# Patient Record
Sex: Male | Born: 2000 | Hispanic: No | Marital: Single | Smoking: Never smoker
Health system: Southern US, Community
[De-identification: ages and names within clinical notes are randomized; demographics above are authoritative.]

---
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
## 2018-06-04 ENCOUNTER — Inpatient Hospital Stay (HOSPITAL_COMMUNITY)
Admission: AD | Admit: 2018-06-04 | Discharge: 2018-06-12 | DRG: 885 | Disposition: A | Discharge status: Home / Self Care | Payer: No Typology Code available for payment source | Attending: Psychiatry | Admitting: Psychiatry

## 2018-06-04 ENCOUNTER — Other Ambulatory Visit: Payer: Self-pay

## 2018-06-04 ENCOUNTER — Encounter (HOSPITAL_COMMUNITY): Payer: Self-pay

## 2018-06-04 DIAGNOSIS — Z5320 Procedure and treatment not carried out because of patient's decision for unspecified reasons: Secondary | ICD-10-CM | POA: Diagnosis present

## 2018-06-04 DIAGNOSIS — I10 Essential (primary) hypertension: Secondary | ICD-10-CM | POA: Diagnosis present

## 2018-06-04 DIAGNOSIS — F209 Schizophrenia, unspecified: Secondary | ICD-10-CM | POA: Diagnosis present

## 2018-06-04 DIAGNOSIS — Z7289 Other problems related to lifestyle: Secondary | ICD-10-CM

## 2018-06-04 DIAGNOSIS — F6381 Intermittent explosive disorder: Secondary | ICD-10-CM | POA: Diagnosis present

## 2018-06-04 MED ORDER — ACETAMINOPHEN 325 MG PO TABS: 650.0000 mg | ORAL_TABLET | Freq: Four times a day (QID) | ORAL | Status: DC | PRN

## 2018-06-04 MED ORDER — TRAZODONE HCL 50 MG PO TABS
50.0000 mg | ORAL_TABLET | Freq: Every evening | ORAL | Status: DC | PRN
  Filled 2018-06-04: qty 1

## 2018-06-04 MED ORDER — HYDROXYZINE HCL 25 MG PO TABS
25.0000 mg | ORAL_TABLET | Freq: Three times a day (TID) | ORAL | Status: DC | PRN
  Filled 2018-06-04: qty 1

## 2018-06-04 MED ORDER — ALUM & MAG HYDROXIDE-SIMETH 200-200-20 MG/5ML PO SUSP: 30.0000 mL | ORAL | Status: DC | PRN

## 2018-06-04 MED ORDER — MAGNESIUM HYDROXIDE 400 MG/5ML PO SUSP: 30.0000 mL | Freq: Every day | ORAL | Status: DC | PRN

## 2018-06-04 NOTE — Progress Notes (Signed)
[place] NOVEL CORONAVIRUS (COVID-19) DAILY CHECK-OFF SYMPTOMS - answer yes or no to each - every day NO YES  Have you had a fever in the past 24 hours?  . Fever (Temp > 37.80C / 100F) X   Have you had any of these symptoms in the past 24 hours? . New Cough .  Sore Throat  .  Shortness of Breath .  Difficulty Breathing .  Unexplained Body Aches   X   Have you had any one of these symptoms in the past 24 hours not related to allergies?   . Runny Nose .  Nasal Congestion .  Sneezing   X   If you have had runny nose, nasal congestion, sneezing in the past 24 hours, has it worsened?  X   EXPOSURES - check yes or no X   Have you traveled outside the state in the past 14 days?  X   Have you been in contact with someone with a confirmed diagnosis of COVID-19 or PUI in the past 14 days without wearing appropriate PPE?  X   Have you been living in the same home as a person with confirmed diagnosis of COVID-19 or a PUI (household contact)?    X   Have you been diagnosed with COVID-19?    X              What to do next: Answered NO to all: Answered YES to anything:   Proceed with unit schedule Follow the [facility] Inpatient Flowsheet.   

## 2018-06-04 NOTE — Tx Team (Signed)
Initial Treatment Plan 06/04/2018 7:14 PM [name] OYD:[id number]    PATIENT STRESSORS: Educational concerns Marital or family conflict   PATIENT STRENGTHS: Barrister's clerk for treatment/growth Supportive family/friends   PATIENT IDENTIFIED PROBLEMS: "depression"  "anger"                   DISCHARGE CRITERIA:  Ability to meet basic life and health needs Improved stabilization in mood, thinking, and/or behavior Need for constant or close observation no longer present Verbal commitment to aftercare and medication compliance  PRELIMINARY DISCHARGE PLAN: Attend aftercare/continuing care group Attend PHP/IOP Participate in family therapy Return to previous living arrangement Return to previous work or school arrangements  PATIENT/FAMILY INVOLVEMENT: This treatment plan has been presented to and reviewed with the patient, Lance Rich..  The patient and family have been given the opportunity to ask questions and make suggestions.  [name] [place], [place] 06/04/2018, 7:14 PM

## 2018-06-04 NOTE — H&P (Signed)
Behavioral Health Medical Screening Exam  Lance Rich is an 18 y.o. male.  Total Time spent with patient: 30 minutes Psychiatric Specialty Exam: Physical Exam  Constitutional: He is oriented to person, place, and time. He appears well-developed and well-nourished. No distress.  HENT:  Head: Normocephalic and atraumatic.  Eyes: Pupils are equal, round, and reactive to light. Right eye exhibits no discharge. Left eye exhibits no discharge. No scleral icterus.  Respiratory: Effort normal. No respiratory distress.  Musculoskeletal: Normal range of motion.  Neurological: He is alert and oriented to person, place, and time.  Skin: He is not diaphoretic.  Psychiatric: His mood appears not anxious. He expresses impulsivity and inappropriate judgment. He does not exhibit a depressed mood.    Review of Systems  Constitutional: Negative for chills, diaphoresis, fever, malaise/fatigue and weight loss.  Respiratory: Negative for cough and shortness of breath.   Cardiovascular: Negative for chest pain.  Gastrointestinal: Negative for diarrhea, nausea and vomiting.  Psychiatric/Behavioral: Negative for depression, hallucinations, memory loss, substance abuse and suicidal ideas. The patient is not nervous/anxious and does not have insomnia.     Blood pressure (!) 147/91, pulse 79, temperature 98.3 F (36.8 C), temperature source Oral, resp. rate 20, SpO2 98 %.There is no height or weight on file to calculate BMI.  General Appearance: Casual and Well Groomed  Eye Contact:  Fair  Speech:  Clear and Coherent and Normal Rate  Volume:  Normal  Mood:  Negative  Affect:  Congruent  Thought Process:  Coherent and Descriptions of Associations: Intact  Orientation:  Full (Time, Place, and Person)  Thought Content:  Logical and Hallucinations: Denies  Suicidal Thoughts:  Denies  Homicidal Thoughts:  Denies  Memory:  Immediate;   Good Recent;   Fair  Judgement:  Fair  Insight:  Fair  Psychomotor  Activity:  Normal  Concentration:  Concentration: Good and Attention Span: Good  Recall:  Good  Fund of Knowledge:  Good  Language:  Good  Akathisia:  Negative  Handed:  Right  AIMS (if indicated):     Assets:  Communication Skills Housing Intimacy Leisure Time Physical Health  ADL's:  Intact  Cognition:  WNL  Sleep:          Blood pressure (!) 147/91, pulse 79, temperature 98.3 F (36.8 C), temperature source Oral, resp. rate 20, SpO2 98 %.  Recommendations:  Based on my evaluation the patient does not appear to have an emergency medical condition.  Jackelyn Poling, NP 06/04/2018, 6:00 AM
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
## 2018-06-04 NOTE — Progress Notes (Signed)
[place] NOVEL CORONAVIRUS (COVID-19) DAILY CHECK-OFF SYMPTOMS - answer yes or no to each - every day NO YES  Have you had a fever in the past 24 hours?  . Fever (Temp > 37.80C / 100F) X   Have you had any of these symptoms in the past 24 hours? . New Cough .  Sore Throat  .  Shortness of Breath .  Difficulty Breathing .  Unexplained Body Aches   X   Have you had any one of these symptoms in the past 24 hours not related to allergies?   . Runny Nose .  Nasal Congestion .  Sneezing   X   If you have had runny nose, nasal congestion, sneezing in the past 24 hours, has it worsened?  X   EXPOSURES - check yes or no X   Have you traveled outside the state in the past 14 days?  X   Have you been in contact with someone with a confirmed diagnosis of COVID-19 or PUI in the past 14 days without wearing appropriate PPE?  X   Have you been living in the same home as a person with confirmed diagnosis of COVID-19 or a PUI (household contact)?    X   Have you been diagnosed with COVID-19?    X              What to do next: Answered NO to all: Answered YES to anything:   Proceed with unit schedule Follow the [facility] Inpatient Flowsheet.   

## 2018-06-04 NOTE — Progress Notes (Signed)
Pt transferred to [facility] Adult Unit. Report given to receiving nurse Meriam Sprague, RN.

## 2018-06-04 NOTE — Plan of Care (Signed)
Nurse discussed coping skills with patient.  

## 2018-06-04 NOTE — Discharge Summary (Signed)
  Patient recommended for inpatient psychiatric treatment.  Patient accepted to [facility] inpatient psychiatric treatment  Galen Russman B. Charnelle Bergeman, NP

## 2018-06-04 NOTE — Progress Notes (Addendum)
Patient was escorted from Obs Unit to Adult Unit 500 hall.  Patient denied SI and HI.  Denied A/V hallucinations.  Denied pain.  Patient was given food, drink and oriented to 500 hall. Patient stated he has his own apartment and plans to return to his apartment after discharge.  Has his own car to drive to appointments.  Patient stated he is going to high school.   Patient denied all drug, tobacco, alcohol use.  Respirations even and unlabored.  No signs/symptoms of pain/distress noted on patient's face or body movements. Safety maintained with 15 minute checks.

## 2018-06-04 NOTE — Progress Notes (Signed)
Pt refused to give writer sample of urine. Pt states "I know by law that I don't have to do it". Encouragement given. UA cup left by bedside.

## 2018-06-04 NOTE — Plan of Care (Signed)
D: Patient is alert, pleasant, and cooperative. Denies SI, HI, AVH, and verbally contracts for safety. Patient interaction is minimal. Patient denies physical symptoms/pain.    A: Medications administered per MD order. Support provided. Patient educated on safety on the unit and medications. Routine safety checks every 15 minutes. Patient stated understanding to tell nurse about any new physical symptoms. Patient understands to tell staff of any needs.     R: No adverse drug reactions noted. Patient verbally contracts for safety. Patient remains safe at this time and will continue to monitor.   Problem: Education: Goal: Knowledge of [facility] Education information/materials will improve Outcome: Progressing   Patient oriented to the unit. Patient remains safe and will continue to monitor.   [place] NOVEL CORONAVIRUS (COVID-19) DAILY CHECK-OFF SYMPTOMS - answer yes or no to each - every day NO YES  Have you had a fever in the past 24 hours?  Fever (Temp > 37.80C / 100F) X   Have you had any of these symptoms in the past 24 hours? New Cough  Sore Throat   Shortness of Breath  Difficulty Breathing  Unexplained Body Aches   X   Have you had any one of these symptoms in the past 24 hours not related to allergies?   Runny Nose  Nasal Congestion  Sneezing   X   If you have had runny nose, nasal congestion, sneezing in the past 24 hours, has it worsened?  X   EXPOSURES - check yes or no X   Have you traveled outside the state in the past 14 days?  X   Have you been in contact with someone with a confirmed diagnosis of COVID-19 or PUI in the past 14 days without wearing appropriate PPE?  X   Have you been living in the same home as a person with confirmed diagnosis of COVID-19 or a PUI (household contact)?    X   Have you been diagnosed with COVID-19?    X              What to do next: Answered NO to all: Answered YES to anything:   Proceed with unit schedule Follow the  [facility] Inpatient Flowsheet.

## 2018-06-04 NOTE — Plan of Care (Signed)
[facility] Observation Crisis Plan  Reason for Crisis Plan:  Chronic Mental Illness/Medical Illness and Crisis Stabilization   Plan of Care:  Referral for Inpatient Hospitalization and Referral for Telepsychiatry/Psychiatric Consult  Family Support:  Parents   Current Living Environment:  Private Residence with Parents   Insurance:   Hospital Account    Name Acct ID Class Status Primary Coverage   Azavier, Tosti [id number] BEHAVIORAL HEALTH OBSERVATION Open None        Guarantor Account (for Hospital Account [id number])    Name Relation to Pt Service Area Active? Acct Type   Richrd Prime Self [facility] Yes [facility]   Address Phone       [address] [place], [place] [address] [phone](H)          Coverage Information (for Hospital Account [id number])    Not on file      Legal Guardian:  Pt is own guardian   Primary Care Provider:  Patient, No Pcp Per  Current Outpatient Providers:  Vesta Mixer  Psychiatrist:    Counselor/Therapist:    Compliant with Medications:  Yes  Additional Information:   Tyrone Apple 5/27/20202:08 AM

## 2018-06-05 DIAGNOSIS — F6381 Intermittent explosive disorder: Secondary | ICD-10-CM

## 2018-06-05 MED ORDER — ZIPRASIDONE MESYLATE 20 MG IM SOLR
20.0000 mg | INTRAMUSCULAR | Status: DC | PRN
  Filled 2018-06-05: qty 20

## 2018-06-05 MED ORDER — TEMAZEPAM 30 MG PO CAPS
30.0000 mg | ORAL_CAPSULE | Freq: Every day | ORAL | Status: DC
  Administered 2018-06-11: 30 mg via ORAL
  Filled 2018-06-05 (×4): qty 1

## 2018-06-05 MED ORDER — LORAZEPAM 1 MG PO TABS
1.0000 mg | ORAL_TABLET | ORAL | Status: DC | PRN
  Filled 2018-06-05: qty 1

## 2018-06-05 MED ORDER — RISPERIDONE 3 MG PO TABS
3.0000 mg | ORAL_TABLET | Freq: Two times a day (BID) | ORAL | Status: DC
  Administered 2018-06-05 – 2018-06-07 (×4): 3 mg via ORAL
  Filled 2018-06-05 (×9): qty 1

## 2018-06-05 MED ORDER — ARIPIPRAZOLE 5 MG PO TABS
5.0000 mg | ORAL_TABLET | Freq: Every day | ORAL | Status: DC
  Filled 2018-06-05 (×3): qty 1

## 2018-06-05 MED ORDER — BENZTROPINE MESYLATE 0.5 MG PO TABS
0.5000 mg | ORAL_TABLET | Freq: Two times a day (BID) | ORAL | Status: DC
  Administered 2018-06-05 – 2018-06-12 (×10): 0.5 mg via ORAL
  Filled 2018-06-05: qty 1
  Filled 2018-06-05: qty 14
  Filled 2018-06-05 (×7): qty 1
  Filled 2018-06-05: qty 14
  Filled 2018-06-05 (×11): qty 1

## 2018-06-05 MED ORDER — OLANZAPINE 5 MG PO TBDP
5.0000 mg | ORAL_TABLET | Freq: Three times a day (TID) | ORAL | Status: DC | PRN
  Filled 2018-06-05: qty 1

## 2018-06-05 NOTE — Progress Notes (Signed)
Recreation Therapy Notes   Date: 06/05/2018 Time: 10:00- 10:30  am Location: 500 hall Day Room   Group Topic: Coping Skills   Goal Area(s) Addresses:  Patient will successfully identify what a coping skill is. Patient will successfully identify coping skills they can use post d/c.  Patient will successfully identify benefit of using coping skills post d/c.  Behavioral Response: appropriate, observing   Intervention: Coping skills   Activity: Patients and Lrt had a group discussion on what a coping skill is, and examples of coping skills. Patients and LRT had a group discussion and went over each individual letter of the alphabet, and possible coping skills that were legal, appropriate, and positive to use. Patients followed along and were encouraged to copy down the list (of coping skills) that was talked about in group and written on the board. Patients were encouraged to write any additional coping skills they have on their individual sheets, and brainstorm potential coping skills to utilize post discharge.  Education: Pharmacologist, Building control surveyor.   Education Outcome: Acknowledges education  Clinical Observations/Feedback: Patient came to group late and did not wish to write, although was listening to group conversation. Patient left group shortly after joining group. Patient did not add any remarks or comments to group conversation.Lance Rich, LRT/CTRS       Lance Rich 06/05/2018 12:50 PM

## 2018-06-05 NOTE — [facility] Suicide Risk Assessment (Signed)
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  World Science writer [facility]). Score between 0-7:  no or low risk or alcohol related problems. Score between 8-15:  moderate risk of alcohol related problems. Score between 16-19:  high risk of alcohol related problems. Score 20 or above:  warrants further diagnostic evaluation for alcohol dependence and treatment.   CLINICAL FACTORS:   Schizophrenia:   Paranoid or undifferentiated type   Musculoskeletal: Strength & Muscle Tone: within normal limits Gait & Station: normal Patient leans: N/A  Psychiatric Specialty Exam: Physical Exam  ROS  Blood pressure (!) 154/96, pulse 79, temperature 98.1 F (36.7 C), temperature source Oral, resp. rate 18, height 5\' 10"  (1.778 m), weight 60.8 kg, SpO2 92 %.Body mass index is 19.23 kg/m.  General Appearance: Casual  Eye Contact:  Good  Speech:  Normal Rate  Volume:  Normal  Mood:  Euthymic  Affect:  Congruent and Constricted  Thought Process:  Linear and Descriptions of Associations: Loose  Orientation:  Full (Time, Place, and Person)  Thought Content:  Illogical and Hallucinations: Auditory  Suicidal Thoughts:  No  Homicidal Thoughts:  No  Memory:  3/3  Judgement:  Fair  Insight:  Shallow  Psychomotor Activity:  Normal  Concentration:  Concentration: Fair  Recall:  [employer] of Knowledge:  Fair  Language:  Fair  Akathisia:   Negative  Handed:  Right  AIMS (if indicated):     Assets:  Physical Health Resilience Social Support Talents/Skills  ADL's:  Intact  Cognition:  WNL  Sleep:  Number of Hours: 2      COGNITIVE FEATURES THAT CONTRIBUTE TO RISK:  Loss of executive function    SUICIDE RISK:   Minimal: No identifiable suicidal ideation.  Patients presenting with no risk factors but with morbid ruminations; may be classified as minimal risk based on the severity of the depressive symptoms  PLAN OF CARE: see orders   I certify that inpatient services furnished can reasonably be expected to improve the patient's condition.   Malvin Johns, MD 06/05/2018, 11:21 AM

## 2018-06-05 NOTE — Progress Notes (Signed)
Patient denies SI, HI and AVH.  Patient has attended groups, been compliant with medications and had no incidents of behavioral dyscontrol this shift.   Assess patient for safety, offer medication as prescribed, engage patient in 1:1 staff talks.   Patient able to contract for safety continue to monitor as planned.  
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
## 2018-06-05 NOTE — [facility] Group Notes (Signed)
[facility] LCSW Group Therapy Note  Date/Time: 06/05/18, 1315  Type of Therapy/Topic:  Group Therapy:  Balance in Life  Participation Level:  Did not attend  Description of Group:    This group will address the concept of balance and how it feels and looks when one is unbalanced. Patients will be encouraged to process areas in their lives that are out of balance, and identify reasons for remaining unbalanced. Facilitators will guide patients utilizing problem- solving interventions to address and correct the stressor making their life unbalanced. Understanding and applying boundaries will be explored and addressed for obtaining  and maintaining a balanced life. Patients will be encouraged to explore ways to assertively make their unbalanced needs known to significant others in their lives, using other group members and facilitator for support and feedback.  Therapeutic Goals: 1. Patient will identify two or more emotions or situations they have that consume much of in their lives. 2. Patient will identify signs/triggers that life has become out of balance:  3. Patient will identify two ways to set boundaries in order to achieve balance in their lives:  4. Patient will demonstrate ability to communicate their needs through discussion and/or role plays  Summary of Patient Progress:          Therapeutic Modalities:   Cognitive Behavioral Therapy Solution-Focused Therapy Assertiveness Training  Greg Lamanda Rudder, LCSW 

## 2018-06-06 NOTE — Progress Notes (Signed)
Patient refusing to give urine sample and for this writer to obtain EKG. "I"m not looking to do all that. I just want to be left alone." Explained to patient this would help precipitate his discharge planning however patient again refused.

## 2018-06-06 NOTE — Progress Notes (Signed)
Recreation Therapy Notes  Date: 06/06/2018 Time: 10:00 am Location: 500 hall   Group Topic: Introduction to Anxiety  Goal Area(s) Addresses:  Patient will work on worksheet on Introduction to Anxiety. Patient will follow directions on first prompt.  Behavioral Response: Appropriate  Intervention: Worksheet  Activity:  Staff on 500 hall were provided with a worksheet on Introduction to Anxiety. Staff was instructed to give it to the patients and have them work on it in place of Recreation Therapy Group. Staff was also given 2 coloring sheets and were given the option to give them out.  Education:  Ability to follow Directions, Change of thought processes Discharge Planning.   Education Outcome: Acknowledges education/In group clarification offered  Clinical Observations/Feedback: . Due to COVID-19, guidelines group was not held. Group members were provided a learning activity worksheet to work on the topic and above-stated goals. LRT is available to answer any questions patient may have regarding the worksheet.  Lance Rich, LRT/CTRS         Lance Rich 06/06/2018 12:07 PM

## 2018-06-06 NOTE — Progress Notes (Signed)
Patient denies SI, HI and AVH this shift.  Patient has attended groups and been compliant with medications and participated in unit activities.  Patient has exhibited no behavioral dyscontrol this shift.   Assess patient for safety, offer medications as prescribed, engage patient in 1:1 therapeutic staff talks.   Patient able to contract for safety continue to monitor as planned.   

## 2018-06-06 NOTE — Progress Notes (Signed)
Pt in bed resting with eyes closed no distress noted and no complaints   Q 15 min checks for safety   Safety maintained

## 2018-06-06 NOTE — Progress Notes (Signed)
[facility] MD Progress Note  06/06/2018 1:09 PM Lance Rich  MRN:  [id number] Subjective:   In summary this 18 year old was petition for recent violence, auditory hallucinations, delusional believes he is generally stabilizing he is compliant with meds he has no involuntary movements today.  No thoughts of harming self or others denying all positive symptoms.  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 No pertinent past medical history. History reviewed. No pertinent surgical history. Family History:  Family History  Problem Relation Age of Onset  . Healthy Mother   . Healthy Father     Social History:  Social History   Substance and Sexual Activity  Alcohol Use No     Social History   Substance and Sexual Activity  Drug Use Not Currently  . Types: Marijuana    Social History   Socioeconomic History  . Marital status: Single    Spouse name: Not on file  . Number of children: Not on file  . Years of education: Not on file  . Highest education level: Not on file  Occupational History  . Not on file  Social Needs  . Financial resource strain: Not on file  . Food insecurity:    Worry: Not on file    Inability: Not on file  . Transportation needs:    Medical: Not on file    Non-medical: Not on file  Tobacco Use  . Smoking status: Never Smoker  . Smokeless tobacco: Never Used  Substance and Sexual Activity  . Alcohol use: No  . Drug use: Not Currently    Types: Marijuana  . Sexual activity: Not on file  Lifestyle  . Physical activity:    Days per week: Not on file    Minutes per session: Not on file  . Stress: Not on file  Relationships  . Social connections:    Talks on phone: Not on file    Gets together: Not on file   Attends religious service: Not on file    Active member of club or organization: Not on file    Attends meetings of clubs or organizations: Not on file    Relationship status: Not on file  Other Topics Concern  . Not on file  Social History Narrative  . Not on file   Additional Social History:    Pain Medications: Please see MAR Prescriptions: Please see MAR Over the Counter: Please see MAR History of alcohol / drug use?: Yes Longest period of sobriety (when/how long): 2 years Name of Substance 1: Marijuana 1 - Age of First Use: 16 1 - Amount (size/oz): Varied 1 - Frequency: Varied 1 - Duration: Unknown 1 - Last Use / Amount: 2 years ago                  Sleep: Good  Appetite:  Good  Current Medications: Current Facility-Administered Medications  Medication Dose Route Frequency Provider Last Rate Last Dose  . acetaminophen (TYLENOL) tablet 650 mg  650 mg Oral Q6H PRN Rankin, Shuvon B, NP      . alum & mag hydroxide-simeth (MAALOX/MYLANTA) 200-200-20 MG/5ML suspension 30 mL  30 mL Oral Q4H PRN Rankin, Shuvon B, NP      . benztropine (COGENTIN) tablet 0.5 mg  0.5 mg Oral BID Malvin Johns, MD   0.5  mg at 06/06/18 0827  . hydrOXYzine (ATARAX/VISTARIL) tablet 25 mg  25 mg Oral TID PRN Rankin, Shuvon B, NP      . OLANZapine zydis (ZYPREXA) disintegrating tablet 5 mg  5 mg Oral Q8H PRN Aldean BakerSykes, Janet E, NP       And  . LORazepam (ATIVAN) tablet 1 mg  1 mg Oral PRN Aldean BakerSykes, Janet E, NP       And  . ziprasidone (GEODON) injection 20 mg  20 mg Intramuscular PRN Aldean BakerSykes, Janet E, NP      . magnesium hydroxide (MILK OF MAGNESIA) suspension 30 mL  30 mL Oral Daily PRN Rankin, Shuvon B, NP      . risperiDONE (RISPERDAL) tablet 3 mg  3 mg Oral BID Malvin JohnsFarah, Mauriana Dann, MD   3 mg at 06/06/18 0827  . temazepam (RESTORIL) capsule 30 mg  30 mg Oral QHS Malvin JohnsFarah, Nile Prisk, MD      . traZODone (DESYREL) tablet 50 mg  50 mg Oral QHS PRN Rankin, Shuvon B, NP        Lab Results: No results found for this or  any previous visit (from the past 48 hour(s)).  Blood Alcohol level:  Lab Results  Component Value Date   ETH <5 05/07/2016   ETH <5 03/15/2016    Metabolic Disorder Labs: No results found for: HGBA1C, MPG No results found for: PROLACTIN No results found for: CHOL, TRIG, HDL, CHOLHDL, VLDL, LDLCALC  Physical Findings: AIMS:  , ,  ,  ,    CIWA:    COWS:     Musculoskeletal: Strength & Muscle Tone: within normal limits Gait & Station: normal Patient leans: N/A  Psychiatric Specialty Exam: Physical Exam  ROS  Blood pressure (!) 154/96, pulse 79, temperature 98.1 F (36.7 C), temperature source Oral, resp. rate 18, height 5\' 10"  (1.778 m), weight 60.8 kg, SpO2 92 %.Body mass index is 19.23 kg/m.  General Appearance: Casual  Eye Contact:  Good  Speech:  Clear and Coherent  Volume:  Normal  Mood:  Euthymic  Affect:  Blunt and Congruent  Thought Process:  Irrelevant and Descriptions of Associations: Loose  Orientation:  Full (Time, Place, and Person)  Thought Content:  Illogical and Tangential  Suicidal Thoughts:  No  Homicidal Thoughts:  No  Memory:  Immediate;   Fair  Judgement:  Fair  Insight:  Fair  Psychomotor Activity:  Normal  Concentration:  Concentration: Fair  Recall:  Fair  Fund of Knowledge:  Good  Language:  Good  Akathisia:  Negative  Handed:  Right  AIMS (if indicated):     Assets:  Communication Skills Leisure Time Physical Health  ADL's:  Intact  Cognition:  WNL  Sleep:  Number of Hours: 4.5     Treatment Plan Summary: Daily contact with patient to assess and evaluate symptoms and progress in treatment, Medication management and Plan Continue Risperdal continue reality-based therapy probable discharge at the beginning of next week  Tyna Huertas, MD 06/06/2018, 1:09 PM

## 2018-06-06 NOTE — [facility] Group Notes (Signed)
06/06/18, 1330  CSW completed group with patients.  Unstructured, check in group about how patients were currently doing today.  Group discussion also included how to manage receiving bad news and how patients cope with negative developments in their day.  Pt attended entire group but did not appear engaged or to be paying much attention.  When called upon by CSW, pt responded appropriately, said he was doing great today and all was well. Garner Nash, MSW, LCSW Clinical Social Worker 06/06/2018 3:33 PM

## 2018-06-06 NOTE — Progress Notes (Signed)
D: Patient observed resting in bed. On approach patient is guarded, resistant to interaction with this Clinical research associate. Becomes irritable with basic questions and refuses to provide urine sample, complete EKG or take scheduled restoril. Patient denies AVH however is observed to be preoccupied. Patient's affect pensive, preoccupied and mood is labile.   Denies pain, physical complaints. COVID-19 screen negative, afebrile. Respiratory assessment WDL.  A: Patient refusing sleep medication however medication education provided. Level III obs in place for safety. Emotional support offered. Patient encouraged to complete Suicide Safety Plan before discharge. Encouraged to attend and participate in unit programming.    R: Patient verbalizes some understanding of POC. Patient denies SI/HI/AVH and remains safe on level III obs. Will continue to monitor throughout the night.

## 2018-06-06 NOTE — Tx Team (Signed)
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 acetaminophen (TYLENOL) tablet 650 mg  650 mg Oral Q6H PRN Rankin, Shuvon B, NP      . alum & mag hydroxide-simeth (MAALOX/MYLANTA) 200-200-20 MG/5ML suspension 30 mL  30 mL Oral Q4H PRN Rankin, Shuvon B, NP      . benztropine (COGENTIN) tablet 0.5 mg  0.5 mg Oral BID Malvin JohnsFarah, Brian, MD   0.5 mg at 06/06/18 0827  . hydrOXYzine (ATARAX/VISTARIL) tablet 25 mg  25 mg Oral TID PRN Rankin, Shuvon B, NP      . OLANZapine zydis (ZYPREXA) disintegrating tablet 5 mg  5 mg Oral Q8H PRN Aldean BakerSykes, Janet E, NP       And  . LORazepam (ATIVAN) tablet 1 mg  1 mg Oral PRN Aldean BakerSykes, Janet E, NP       And  . ziprasidone (GEODON) injection 20 mg  20 mg Intramuscular PRN Aldean BakerSykes, Janet E, NP      . magnesium hydroxide (MILK OF MAGNESIA) suspension 30 mL  30 mL Oral Daily PRN Rankin, Shuvon B, NP      . risperiDONE (RISPERDAL) tablet 3 mg  3 mg Oral BID Malvin JohnsFarah, Brian, MD   3 mg at 06/06/18 0827  . temazepam (RESTORIL) capsule 30 mg  30 mg Oral QHS Malvin JohnsFarah, Brian, MD      . traZODone (DESYREL) tablet 50 mg  50 mg Oral QHS PRN Rankin, Shuvon B, NP       PTA Medications: Medications Prior to Admission  Medication Sig Dispense Refill Last Dose  . escitalopram (LEXAPRO) 10 MG tablet Take 10 mg by mouth daily.   Past Month at Unknown time  . hydrOXYzine (ATARAX/VISTARIL) 25 MG tablet Take 25 mg by mouth every 6 (six) hours as needed for anxiety.   Past Month at Unknown time    Patient Stressors: Educational concerns Marital or family conflict  Patient Strengths:  Barrister's clerkCommunication skills Motivation for treatment/growth Supportive family/friends  Treatment Modalities: Medication Management, Group therapy, Case management,  1 to 1 session with clinician, Psychoeducation, Recreational therapy.   Physician Treatment Plan for Primary Diagnosis: Intermittent explosive disorder Long Term Goal(s): Improvement in symptoms so as ready for discharge Improvement in symptoms so as ready for discharge   Short Term Goals: Ability to identify changes in lifestyle to reduce recurrence of condition will improve Ability to verbalize feelings will improve Ability to demonstrate self-control will improve Ability to identify and develop effective coping behaviors will improve Compliance with prescribed medications will improve  Medication Management: Evaluate patient's response, side effects, and tolerance of medication regimen.  Therapeutic Interventions: 1 to 1 sessions, Unit Group sessions and Medication administration.  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  Therapeutic Interventions: 1 to 1 sessions, Unit Group sessions and Medication administration.  Evaluation of Outcomes: Progressing   RN Treatment Plan for Primary Diagnosis:  Intermittent explosive disorder Long Term Goal(s): Knowledge of disease and therapeutic regimen to maintain health will improve  Short Term Goals: Ability to identify and develop effective coping behaviors will improve and Compliance with prescribed medications will improve  Medication Management: RN will administer medications as ordered by provider, will assess and evaluate patient's response and provide education to patient for prescribed medication. RN will report any adverse and/or side effects to prescribing provider.  Therapeutic Interventions: 1 on 1 counseling sessions, Psychoeducation, Medication administration, Evaluate responses to treatment, Monitor vital signs and CBGs as ordered, Perform/monitor CIWA, COWS, AIMS and Fall Risk screenings as ordered, Perform wound care treatments as ordered.  Evaluation of Outcomes: Progressing   LCSW Treatment Plan for Primary Diagnosis: Intermittent explosive disorder Long Term Goal(s): Safe transition to appropriate next level of care at discharge, Engage patient in therapeutic group addressing interpersonal concerns.  Short Term Goals: Engage patient in aftercare planning with referrals and resources, Increase social support and Increase skills for wellness and recovery  Therapeutic Interventions: Assess for all discharge needs, 1 to 1 time with Social worker, Explore available resources and support systems, Assess for adequacy in community support network, Educate family and significant other(s) on suicide prevention, Complete Psychosocial Assessment, Interpersonal group therapy.  Evaluation of Outcomes: Progressing   Progress in Treatment: Attending groups: No. Participating in groups: No. Taking medication as prescribed: Yes. Toleration medication: Yes. Family/Significant other contact made: No, will contact:  parents Patient understands diagnosis: Yes. Discussing patient identified problems/goals with staff: Yes. Medical problems  stabilized or resolved: Yes. Denies suicidal/homicidal ideation: Yes. Issues/concerns per patient self-inventory: No. Other: none  New problem(s) identified: No, Describe:  none  New Short Term/Long Term Goal(s):  Patient Goals:  "better mental health"  Discharge Plan or Barriers:   Reason for Continuation of Hospitalization: Delusions  Medication stabilization  Estimated Length of Stay:2-4 days  Attendees: Patient:Lance Rich 06/06/2018   Physician: Dr. Jeannine Kitten, MD 06/06/2018   Nursing: Norma Fredrickson, RN 06/06/2018   RN Care Manager: 06/06/2018   Social Worker: Daleen Squibb, LCSW 06/06/2018   Recreational Therapist:  06/06/2018   Other:  06/06/2018   Other:  06/06/2018   Other: 06/06/2018      Scribe for Treatment Team: Lorri Frederick, LCSW 06/06/2018 1:25 PM

## 2018-06-07 MED ORDER — RISPERIDONE 1 MG PO TABS
1.0000 mg | ORAL_TABLET | Freq: Every day | ORAL | Status: DC
  Administered 2018-06-08 – 2018-06-11 (×4): 1 mg via ORAL
  Filled 2018-06-07 (×5): qty 1

## 2018-06-07 MED ORDER — RISPERIDONE 3 MG PO TABS
5.0000 mg | ORAL_TABLET | Freq: Every day | ORAL | Status: DC
  Filled 2018-06-07 (×6): qty 1

## 2018-06-07 NOTE — Progress Notes (Signed)
Patient ID: Marlene Hathway, male   DOB: May 14, 2000, 18 y.o.   MRN: [id number]  While cleaning the room, the environmental service staff member observed that the bolted bench had been pulled out of the wall. Upon inspection from patient's nurse and writer, the screw was observed on the floor and the bench was pulled out several inches on the left side. Writer removed screw from the room and pushed bench into the wall. Patient was instructed to only use the bench for sitting/storage of personal belongings and not to move it. Patient verbalized understanding and was unharmed. Facilities notified and a work order was placed.

## 2018-06-07 NOTE — Progress Notes (Signed)
D: Upon initial interaction, patient is observed standing at the door of his room. Patient appears watchful and suspicious of others. It is also noted that patients furniture (bench/cubby) was removed from the wall. Loose screw removed and Charge RN notified. Patient is minimal and replies to this writers questions with head nods, though interactions have become much more assertive as the day progresses. Patient denies any sleep or appetite disturbance when asked, and shared that he ate some of his breakfast. Patient was observed by staff throwing away his salad from lunch without eating it. Patient denies that he wanted anything else to eat at the time. Patient continues to refuse to provide urine sample to staff. Patient remains compliant with day time medications at this time.   A: Support and encouragement provided. Routine safety checks conducted every 15 minutes per unit protocol. Encouraged to notify if thoughts of harm toward self or others arise. Patient agrees.   R: Patient remains safe at this time, verbally contracting for safety. Will continue to monitor.

## 2018-06-07 NOTE — Progress Notes (Signed)
Adult Psychoeducational Group Note  Date:  06/07/2018 Time:  9:30am-10:00am  Group Topic/Focus:  Goals Group:   The focus of this group is to help patients establish daily goals to achieve during treatment and discuss how the patient can incorporate goal setting into their daily lives to aide in recovery.  Participation Level:  Active  Participation Quality:  Appropriate and Attentive  Affect:  Appropriate  Cognitive:  Alert, Appropriate and Oriented  Insight: Appropriate and Good  Engagement in Group:  Engaged  Modes of Intervention:  Discussion and Support  Additional Comments:  Patient informed the group that his goal for the day is to keep his energy high and meet others. Patient informed the group that he will work towards this goal by recognizing that his fellow group members share problems and by spending time with others and working to understand others within the group he can get to know them better. Patient informed the group that he would rate his day as a 10. Patient informed the group that his day is a 10 because he is happy and comfortable where he is at. Patient informed the group that he is looking forward to meeting and talking to others.   Annye Asa 06/07/2018, 5:14 PM

## 2018-06-07 NOTE — Progress Notes (Signed)
Update: Patient continues to refuse urine sample.

## 2018-06-07 NOTE — Progress Notes (Addendum)
[facility] MD Progress Note  06/07/2018 2:43 PM Lance Rich  MRN:  [id number]   Subjective: Patient reports today that he is feeling good.  He states that his only complaint is that the risperidone was making him too sleepy during the mornings and he does not feel like he can get out of bed and do much.  He states he agrees to take it but just not that have a dose.  Patient denies any suicidal or homicidal ideations and patient currently denies any hallucinations.  Patient stated that he slept very well last night and he denies any medication side effects.  Patient reports that his appetite is been fair.  He feels that he is improving.  He is in agreement to stay through the weekend and potentially be discharged on Monday per Dr. Jeannine Kitten.  Objective: Patient's chart and findings reviewed and discussed with treatment team.  Patient presents in his room lying in the bed but he is awake.  Patient is pleasant, calm, and cooperative.  Reviewed patient's medications and will adjust the Risperdal to 1 mg during the day and 5 mg at night to continue having a total dosage of 6 mg for the day.  Patient did refuse to take his Restoril last night, but based on the patient's report of sleep the Risperdal may be plenty for him to sleep through the night.  It was also reported by staff that it appears that the patient has been responding to internal stimuli but continues to deny having any type of auditory or visual hallucinations.  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 No pertinent past medical history. History reviewed. No pertinent surgical history. Family History:  Family History  Problem Relation Age of Onset  . Healthy Mother   . Healthy Father    Family Psychiatric  History:  See H&P Social History:  Social History   Substance and Sexual Activity  Alcohol Use No     Social History   Substance and Sexual Activity  Drug Use Not Currently  . Types: Marijuana    Social History   Socioeconomic History  . Marital status: Single    Spouse name: Not on file  . Number of children: Not on file  . Years of education: Not on file  . Highest education level: Not on file  Occupational History  . Not on file  Social Needs  . Financial resource strain: Not on file  . Food insecurity:    Worry: Not on file    Inability: Not on file  . Transportation needs:    Medical: Not on file    Non-medical: Not on file  Tobacco Use  . Smoking status: Never Smoker  . Smokeless tobacco: Never Used  Substance and Sexual Activity  . Alcohol use: No  . Drug use: Not Currently    Types: Marijuana  . Sexual activity: Not on file  Lifestyle  . Physical activity:    Days per week: Not on file    Minutes per session: Not on file  . Stress: Not on file  Relationships  . Social connections:    Talks on phone: Not on file    Gets together: Not on file    Attends religious service: Not on file    Active member of club or organization: Not  on file    Attends meetings of clubs or organizations: Not on file    Relationship status: Not on file  Other Topics Concern  . Not on file  Social History Narrative  . Not on file   Additional Social History:    Pain Medications: Please see MAR Prescriptions: Please see MAR Over the Counter: Please see MAR History of alcohol / drug use?: Yes Longest period of sobriety (when/how long): 2 years Name of Substance 1: Marijuana 1 - Age of First Use: 16 1 - Amount (size/oz): Varied 1 - Frequency: Varied 1 - Duration: Unknown 1 - Last Use / Amount: 2 years ago                  Sleep: Good  Appetite:  Fair  Current Medications: Current Facility-Administered Medications  Medication Dose Route Frequency Provider Last  Rate Last Dose  . acetaminophen (TYLENOL) tablet 650 mg  650 mg Oral Q6H PRN Rankin, Shuvon B, NP      . alum & mag hydroxide-simeth (MAALOX/MYLANTA) 200-200-20 MG/5ML suspension 30 mL  30 mL Oral Q4H PRN Rankin, Shuvon B, NP      . benztropine (COGENTIN) tablet 0.5 mg  0.5 mg Oral BID Malvin JohnsFarah, Brian, MD   0.5 mg at 06/07/18 0849  . hydrOXYzine (ATARAX/VISTARIL) tablet 25 mg  25 mg Oral TID PRN Rankin, Shuvon B, NP      . OLANZapine zydis (ZYPREXA) disintegrating tablet 5 mg  5 mg Oral Q8H PRN Aldean BakerSykes, Janet E, NP       And  . LORazepam (ATIVAN) tablet 1 mg  1 mg Oral PRN Aldean BakerSykes, Janet E, NP       And  . ziprasidone (GEODON) injection 20 mg  20 mg Intramuscular PRN Aldean BakerSykes, Janet E, NP      . magnesium hydroxide (MILK OF MAGNESIA) suspension 30 mL  30 mL Oral Daily PRN Rankin, Shuvon B, NP      . [START ON 06/08/2018] risperiDONE (RISPERDAL) tablet 1 mg  1 mg Oral Daily Money, Gerlene Burdockravis B, FNP      . risperiDONE (RISPERDAL) tablet 5 mg  5 mg Oral QHS Money, Feliz Beamravis B, FNP      . temazepam (RESTORIL) capsule 30 mg  30 mg Oral QHS Malvin JohnsFarah, Brian, MD      . traZODone (DESYREL) tablet 50 mg  50 mg Oral QHS PRN Rankin, Shuvon B, NP        Lab Results: No results found for this or any previous visit (from the past 48 hour(s)).  Blood Alcohol level:  Lab Results  Component Value Date   ETH <5 05/07/2016   ETH <5 03/15/2016    Metabolic Disorder Labs: No results found for: HGBA1C, MPG No results found for: PROLACTIN No results found for: CHOL, TRIG, HDL, CHOLHDL, VLDL, LDLCALC  Physical Findings: AIMS:  , ,  ,  ,    CIWA:    COWS:     Musculoskeletal: Strength & Muscle Tone: within normal limits Gait & Station: normal Patient leans: N/A  Psychiatric Specialty Exam: Physical Exam  ROS  Blood pressure (!) 136/96, pulse 72, temperature 97.7 F (36.5 C), temperature source Axillary, resp. rate 20, height 5\' 10"  (1.778 m), weight 60.8 kg, SpO2 92 %.Body mass index is 19.23 kg/m.  General  Appearance: Casual  Eye Contact:  Good  Speech:  Clear and Coherent and Normal Rate  Volume:  Normal  Mood:  Euthymic  Affect:  Congruent  Thought Process:  Coherent and Descriptions of Associations: Intact  Orientation:  Full (Time, Place, and Person)  Thought Content:  WDL  Suicidal Thoughts:  No  Homicidal Thoughts:  No  Memory:  Immediate;   Good Recent;   Good Remote;   Good  Judgement:  Fair  Insight:  Fair  Psychomotor Activity:  Normal  Concentration:  Concentration: Good and Attention Span: Good  Recall:  Good  Fund of Knowledge:  Good  Language:  Good  Akathisia:  No  Handed:  Right  AIMS (if indicated):     Assets:  Communication Skills Desire for Improvement Financial Resources/Insurance Housing Physical Health Social Support Transportation  ADL's:  Intact  Cognition:  WNL  Sleep:  Number of Hours: 4.5   Problems addressed Schizophrenia Intermittent explosive disorder  Treatment Plan Summary: Daily contact with patient to assess and evaluate symptoms and progress in treatment, Medication management and Plan is to: Continue Cogentin 0.5 mg p.o. twice daily for EPS Continue Vistaril 25 mg p.o. 3 times daily PRN for anxiety Continue agitation protocol of Zyprexa, Ativan, and Geodon Adjust Risperdal to 1 mg p.o. daily and 5 mg p.o. nightly for schizophrenia Encourage group therapy participation Continue Restoril 30 mg p.o. nightly for insomnia Continue trazodone 50 mg p.o. nightly as needed for insomnia Continue every 15 minute safety checks CSW to assist with disposition  Gerlene Burdock Money, FNP 06/07/2018, 2:43 PM  Attest to NP progress note

## 2018-06-07 NOTE — Progress Notes (Signed)
[place] NOVEL CORONAVIRUS (COVID-19) DAILY CHECK-OFF SYMPTOMS - answer yes or no to each - every day NO YES  Have you had a fever in the past 24 hours?  . Fever (Temp > 37.80C / 100F) X   Have you had any of these symptoms in the past 24 hours? . New Cough .  Sore Throat  .  Shortness of Breath .  Difficulty Breathing .  Unexplained Body Aches   X   Have you had any one of these symptoms in the past 24 hours not related to allergies?   . Runny Nose .  Nasal Congestion .  Sneezing   X   If you have had runny nose, nasal congestion, sneezing in the past 24 hours, has it worsened?  X   EXPOSURES - check yes or no X   Have you traveled outside the state in the past 14 days?  X   Have you been in contact with someone with a confirmed diagnosis of COVID-19 or PUI in the past 14 days without wearing appropriate PPE?  X   Have you been living in the same home as a person with confirmed diagnosis of COVID-19 or a PUI (household contact)?    X   Have you been diagnosed with COVID-19?    X              What to do next: Answered NO to all: Answered YES to anything:   Proceed with unit schedule Follow the [facility] Inpatient Flowsheet.   

## 2018-06-07 NOTE — [facility] Group Notes (Signed)
LCSW Group Therapy Note  06/07/2018       Type of Therapy and Topic:  Group Therapy: "My Mental Health"  Participation Level:  Active   Description of Group:   In this group, patients were asked four questions in order to generate discussion around the idea of mental illness and/or substance addiction being medical problems: 1. In one sentence describe the current state of your mental health or substance use. 2. How much do you feel similar to or different from others? 3. Do you tend to identify with other people or compare yourself to them?  4. In a word or sentence, share what you desire your mental health/substance use to be.  Discussion was held that led to the conclusion that comparing ourselves to others is not healthy, but identifying with the elements of their issues that are similar to ours is helpful.    Therapeutic Goals: 1. Patients will identify their feelings about their current mental health/substance use problems. 2. Patients will describe how they feel similar to or different from others, and whether they tend to identify with or compare themselves to other people with the same issues. 3. Patients will explore the differences in these concepts and how a change of mindset about mental health/substance use can help with reaching recovery goals. 4. Patients will think about and share what their recovery goals are, in terms of mental health and/or substance use.  Summary of Patient Progress:  The patient shared that the current state of his mental health is "the high beam, myself."  His speech was often grandiose in nature and off topic or bizarre.  He said to one patient who expressed fear about something, "fear and anxiety is just an illusion."  His reasoning was not logical.  He kept interjecting such comments until CSW redirected him.    Therapeutic Modalities:   Processing Motivation Interviewing Psychoeducation  Lynnell Chad, MSW, LCSW 6:03 PM  .

## 2018-06-08 NOTE — Progress Notes (Signed)
Patient ID: Lance Rich, male   DOB: [birth year], 18 y.o.   MRN: [id number]  [place] NOVEL CORONAVIRUS (COVID-19) DAILY CHECK-OFF SYMPTOMS - answer yes or no to each - every day NO YES  Have you had a fever in the past 24 hours?  . Fever (Temp > 37.80C / 100F) X   Have you had any of these symptoms in the past 24 hours? . New Cough .  Sore Throat  .  Shortness of Breath .  Difficulty Breathing .  Unexplained Body Aches   X   Have you had any one of these symptoms in the past 24 hours not related to allergies?   . Runny Nose .  Nasal Congestion .  Sneezing   X   If you have had runny nose, nasal congestion, sneezing in the past 24 hours, has it worsened?  X   EXPOSURES - check yes or no X   Have you traveled outside the state in the past 14 days?  X   Have you been in contact with someone with a confirmed diagnosis of COVID-19 or PUI in the past 14 days without wearing appropriate PPE?  X   Have you been living in the same home as a person with confirmed diagnosis of COVID-19 or a PUI (household contact)?    X   Have you been diagnosed with COVID-19?    X              What to do next: Answered NO to all: Answered YES to anything:   Proceed with unit schedule Follow the [facility] Inpatient Flowsheet.

## 2018-06-08 NOTE — [facility] Group Notes (Signed)
[facility] LCSW Group Therapy Note  06/08/2018  10:00-11:00AM  Type of Therapy and Topic:  Group Therapy:  Adding Supports Including Yourself  Participation Level:  Active   Description of Group:  Patients in this group were introduced to the concept that additional supports including self-support are an essential part of recovery.  Patients listed what supports they believe they need to add to their lives to achieve their goals at discharge, and they listed such things as therapist, family, doctor, support groups, and service dog.  CSW described the  continuum of mental health/substance abuse services available and the group discussed the differences among these including support group, therapy group, 12-step group, Doctor, hospital, Secondary school teacher, and such.  A song entitled "My Own Hero" was played and a group discussion ensued in which patients stated they could relate to the song and it inspired them to realize they have be willing to help themselves in order to succeed, because other people cannot achieve sobriety or stability for them.  A song was played called "I Am Enough" which led to a discussion about being willing to believe we are worth the effort of being a self-support.  Group members expressed appreciation for each other.  Therapeutic Goals: 1)  demonstrate the importance of being a key part of one's own support system 2)  discuss various available supports 3)  encourage patient to use music as part of their self-support and focus on goals 4)  elicit ideas from patients about supports that need to be added   Summary of Patient Progress:  The patient expressed that he does not feel he needs to add additional supports to his life.  He was attentive and polite during group.   Therapeutic Modalities:   Motivational Interviewing Activity  Lynnell Chad

## 2018-06-08 NOTE — Progress Notes (Addendum)
[facility] MD Progress Note  06/08/2018 11:31 AM Lance Rich  MRN:  [id number]   Subjective: Patient reports that he is feeling much better today than he was yesterday.  He states that he did not feel drowsy today after taking his medications.  Patient denies any suicidal or homicidal ideations and denies any hallucinations.  Patient denies any medication side effects.  Patient states that he likes the way the new medications have been moving around because he slept good last night and does not feel drowsy this morning.  Patient reports having a good appetite and reporting improved mood.  Objective: Patient's chart and findings reviewed and discussed with treatment team.  Patient presents in the day room and is sitting in a chair interacting with peers and staff appropriately.  Patient has been seen attending groups and interacting.  Patient is pleasant, calm, and cooperative during the interview.  There have been no complaints about the patient in last 24 hours.  We will continue dosing patient with current medications as patient has shown significant improvement.  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 No pertinent past medical history. History reviewed. No pertinent surgical history. Family History:  Family History  Problem Relation Age of Onset  . Healthy Mother   . Healthy Father    Family Psychiatric  History: See H&P Social History:  Social History   Substance and Sexual Activity  Alcohol Use No     Social History   Substance and Sexual Activity  Drug Use Not Currently  . Types: Marijuana    Social History   Socioeconomic History  . Marital status: Single    Spouse name: Not on file  . Number of children: Not on file  . Years of education:  Not on file  . Highest education level: Not on file  Occupational History  . Not on file  Social Needs  . Financial resource strain: Not on file  . Food insecurity:    Worry: Not on file    Inability: Not on file  . Transportation needs:    Medical: Not on file    Non-medical: Not on file  Tobacco Use  . Smoking status: Never Smoker  . Smokeless tobacco: Never Used  Substance and Sexual Activity  . Alcohol use: No  . Drug use: Not Currently    Types: Marijuana  . Sexual activity: Not on file  Lifestyle  . Physical activity:    Days per week: Not on file    Minutes per session: Not on file  . Stress: Not on file  Relationships  . Social connections:    Talks on phone: Not on file    Gets together: Not on file    Attends religious service: Not on file    Active member of club or organization: Not on file    Attends meetings of clubs or organizations: Not on file    Relationship status: Not on file  Other Topics Concern  . Not on file  Social History Narrative  . Not on file   Additional Social History:    Pain Medications: Please see MAR Prescriptions: Please see MAR Over the Counter: Please see MAR History of alcohol / drug use?: Yes Longest period of sobriety (when/how long): 2 years Name of Substance 1: Marijuana 1 - Age  of First Use: 16 1 - Amount (size/oz): Varied 1 - Frequency: Varied 1 - Duration: Unknown 1 - Last Use / Amount: 2 years ago                  Sleep: Good  Appetite:  Good  Current Medications: Current Facility-Administered Medications  Medication Dose Route Frequency Provider Last Rate Last Dose  . acetaminophen (TYLENOL) tablet 650 mg  650 mg Oral Q6H PRN Rankin, Shuvon B, NP      . alum & mag hydroxide-simeth (MAALOX/MYLANTA) 200-200-20 MG/5ML suspension 30 mL  30 mL Oral Q4H PRN Rankin, Shuvon B, NP      . benztropine (COGENTIN) tablet 0.5 mg  0.5 mg Oral BID Malvin JohnsFarah, Brian, MD   0.5 mg at 06/08/18 0803  . hydrOXYzine  (ATARAX/VISTARIL) tablet 25 mg  25 mg Oral TID PRN Rankin, Shuvon B, NP      . OLANZapine zydis (ZYPREXA) disintegrating tablet 5 mg  5 mg Oral Q8H PRN Aldean BakerSykes, Janet E, NP       And  . LORazepam (ATIVAN) tablet 1 mg  1 mg Oral PRN Aldean BakerSykes, Janet E, NP       And  . ziprasidone (GEODON) injection 20 mg  20 mg Intramuscular PRN Aldean BakerSykes, Janet E, NP      . magnesium hydroxide (MILK OF MAGNESIA) suspension 30 mL  30 mL Oral Daily PRN Rankin, Shuvon B, NP      . risperiDONE (RISPERDAL) tablet 1 mg  1 mg Oral Daily Money, Travis B, FNP   1 mg at 06/08/18 0801  . risperiDONE (RISPERDAL) tablet 5 mg  5 mg Oral QHS Money, Feliz Beamravis B, FNP      . temazepam (RESTORIL) capsule 30 mg  30 mg Oral QHS Malvin JohnsFarah, Brian, MD      . traZODone (DESYREL) tablet 50 mg  50 mg Oral QHS PRN Rankin, Shuvon B, NP        Lab Results: No results found for this or any previous visit (from the past 48 hour(s)).  Blood Alcohol level:  Lab Results  Component Value Date   ETH <5 05/07/2016   ETH <5 03/15/2016    Metabolic Disorder Labs: No results found for: HGBA1C, MPG No results found for: PROLACTIN No results found for: CHOL, TRIG, HDL, CHOLHDL, VLDL, LDLCALC  Physical Findings: AIMS:  , ,  ,  ,    CIWA:    COWS:     Musculoskeletal: Strength & Muscle Tone: within normal limits Gait & Station: normal Patient leans: N/A  Psychiatric Specialty Exam: Physical Exam  Nursing note and vitals reviewed. Constitutional: He is oriented to person, place, and time. He appears well-developed and well-nourished.  Cardiovascular: Normal rate.  Respiratory: Effort normal.  Musculoskeletal: Normal range of motion.  Neurological: He is alert and oriented to person, place, and time.  Skin: Skin is warm.    Review of Systems  Constitutional: Negative.   HENT: Negative.   Eyes: Negative.   Respiratory: Negative.   Cardiovascular: Negative.   Gastrointestinal: Negative.   Genitourinary: Negative.   Musculoskeletal: Negative.    Skin: Negative.   Neurological: Negative.   Endo/Heme/Allergies: Negative.   Psychiatric/Behavioral: Negative.     Blood pressure (!) 156/106, pulse 76, temperature (!) 97.3 F (36.3 C), temperature source Oral, resp. rate 20, height 5\' 10"  (1.778 m), weight 60.8 kg, SpO2 92 %.Body mass index is 19.23 kg/m.  General Appearance: Casual  Eye Contact:  Good  Speech:  Clear and  Coherent and Normal Rate  Volume:  Normal  Mood:  Euthymic  Affect:  Congruent  Thought Process:  Coherent and Descriptions of Associations: Intact  Orientation:  Full (Time, Place, and Person)  Thought Content:  WDL  Suicidal Thoughts:  No  Homicidal Thoughts:  No  Memory:  Immediate;   Good Recent;   Good Remote;   Good  Judgement:  Fair  Insight:  Fair  Psychomotor Activity:  Normal  Concentration:  Concentration: Good and Attention Span: Good  Recall:  Good  Fund of Knowledge:  Good  Language:  Good  Akathisia:  No  Handed:  Right  AIMS (if indicated):     Assets:  Communication Skills Desire for Improvement Financial Resources/Insurance Housing Physical Health Social Support Transportation  ADL's:  Intact  Cognition:  WNL  Sleep:  Number of Hours: 4.5   Problems Addressed Schizophrenia Intermittent explosive disorder and adult  Treatment Plan Summary: Daily contact with patient to assess and evaluate symptoms and progress in treatment, Medication management and Plan is to:  Continue trazodone 50 mg p.o. nightly as needed for insomnia Continue Restoril 30 mg p.o. nightly for insomnia Continue Risperdal 1 mg p.o. daily and 5 mg p.o. nightly for schizophrenia Continue agitation protocol of Zyprexa, Ativan, and Geodon Continue Vistaril 25 mg p.o. 3 times daily PRN for anxiety Continue Cogentin 0.5 mg p.o. twice daily for EPS Encourage group therapy participation Continue every 15 minute safety checks CSW to assist with disposition Potential plan discharge for tomorrow  Maryfrances Bunnell,  FNP 06/08/2018, 11:31 AM   Attest to NP progress note

## 2018-06-08 NOTE — Progress Notes (Signed)
Pt complained of pain in his neck stating something happened to his neck,  Stated he feels like his chest swelling and would like to have it x rayed. Pt BP is elevated this morning.

## 2018-06-08 NOTE — Progress Notes (Signed)
DAR NOTE: Patient presents with anxious affect and depressed mood. Pt has been very isolative in the room. Refused all his evening medications stating " they make me sleepy and am already sleeping without problems," Denies pain, auditory and visual hallucinations.  Maintained on routine safety checks. Support and encouragement offered as needed.Will continue to monitor,

## 2018-06-08 NOTE — Progress Notes (Signed)
Patient did not attend wrap up group. 

## 2018-06-08 NOTE — Plan of Care (Signed)
  Problem: Education: Goal: Mental status will improve Outcome: Progressing   D: Pt alert and oriented on the unit. Pt denies SI/HI, A/VH. Pt was in dayroom talking with other pts and watching television today. Pt is pleasant and cooperative. A: Education, support and encouragement provided, q15 minute safety checks remain in effect. Medications administered per MD orders. R: No reactions/side effects to medicine noted. Pt denies any concerns at this time, and verbally contracts for safety. Pt ambulating on the unit with no issues. Pt remains safe on and off the unit.

## 2018-06-09 MED ORDER — ARIPIPRAZOLE ER 400 MG IM SRER: 400.0000 mg | INTRAMUSCULAR | Status: DC

## 2018-06-09 MED ORDER — METOPROLOL SUCCINATE ER 50 MG PO TB24
100.0000 mg | ORAL_TABLET | Freq: Every day | ORAL | Status: DC
  Administered 2018-06-10 – 2018-06-11 (×2): 100 mg via ORAL
  Filled 2018-06-09: qty 14
  Filled 2018-06-09 (×5): qty 2

## 2018-06-09 MED ORDER — ARIPIPRAZOLE 2 MG PO TABS
2.0000 mg | ORAL_TABLET | Freq: Once | ORAL | Status: DC
  Filled 2018-06-09: qty 1

## 2018-06-09 NOTE — Plan of Care (Addendum)
Patient was compliant with medication. Denies any side effects. Denies SI HI AVH. Patient is still refusing to give a urine sample and was guarded and quiet during morning medication pass.  Safety is maintained with 15 minute checks as well as environmental checks. Will continue to monitor and assess for safety.  Problem: Education: Goal: Knowledge of [place] [facility] Education information/materials will improve Outcome: Adequate for Discharge   Problem: Education: Goal: Emotional status will improve Outcome: Adequate for Discharge   Problem: Education: Goal: Mental status will improve Outcome: Adequate for Discharge   Problem: Education: Goal: Verbalization of understanding the information provided will improve Outcome: Adequate for Discharge   Problem: Activity: Goal: Interest or engagement in activities will improve Outcome: Adequate for Discharge

## 2018-06-09 NOTE — Progress Notes (Signed)
[facility] MD Progress Note  06/09/2018 10:52 AM Kamaurie Schneller  MRN:  [id number] Subjective:   Patient denies all positive symptoms and is focused on discharge however I spoke with his mother with permission, his mother reports that he will come home and refused to take medications and she is worried that he will have to come right back to the hospital- Mother also elaborated that his father "has the same problem, took shots lasted 3 months" and does well now he only takes medication for sleep.  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 No pertinent past medical history. History reviewed. No pertinent surgical history. Family History:  Family History  Problem Relation Age of Onset  . Healthy Mother   . Healthy Father    Family Psychiatric  History: As above father has had a long-acting injectable antipsychotic according to the mother Social History:  Social History   Substance and Sexual Activity  Alcohol Use No     Social History   Substance and Sexual Activity  Drug Use Not Currently  . Types: Marijuana    Social History   Socioeconomic History  . Marital status: Single    Spouse name: Not on file  . Number of children: Not on file  . Years of education: Not on file  . Highest education level: Not on file  Occupational History  . Not on file  Social Needs  . Financial resource strain: Not on file  . Food insecurity:    Worry: Not on file    Inability: Not on file  . Transportation needs:    Medical: Not on file    Non-medical: Not on file  Tobacco Use  . Smoking status: Never Smoker  . Smokeless tobacco: Never Used  Substance  and Sexual Activity  . Alcohol use: No  . Drug use: Not Currently    Types: Marijuana  . Sexual activity: Not on file  Lifestyle  . Physical activity:    Days per week: Not on file    Minutes per session: Not on file  . Stress: Not on file  Relationships  . Social connections:    Talks on phone: Not on file    Gets together: Not on file    Attends religious service: Not on file    Active member of club or organization: Not on file    Attends meetings of clubs or organizations: Not on file    Relationship status: Not on file  Other Topics Concern  . Not on file  Social History Narrative  . Not on file   Additional Social History:    Pain Medications: Please see MAR Prescriptions: Please see MAR Over the Counter: Please see MAR History of alcohol / drug use?: Yes Longest period of sobriety (when/how long): 2 years Name of Substance 1: Marijuana 1 - Age of First Use: 16 1 - Amount (size/oz): Varied 1 - Frequency: Varied 1 - Duration: Unknown 1 - Last Use / Amount: 2 years ago                  Sleep: Good  Appetite:  Good  Current Medications: Current Facility-Administered Medications  Medication Dose Route Frequency Provider Last Rate Last Dose  . acetaminophen (TYLENOL) tablet 650 mg  650 mg Oral Q6H PRN Rankin, Shuvon B, NP      . alum & mag hydroxide-simeth (MAALOX/MYLANTA) 200-200-20 MG/5ML suspension 30 mL  30 mL Oral Q4H PRN Rankin, Shuvon B, NP      . ARIPiprazole (ABILIFY) tablet 2 mg  2 mg Oral Once Malvin JohnsFarah, Icarus Partch, MD      . benztropine (COGENTIN) tablet 0.5 mg  0.5 mg Oral BID Malvin JohnsFarah, Milanna Kozlov, MD   0.5 mg at 06/09/18 [id number]  . hydrOXYzine (ATARAX/VISTARIL) tablet 25 mg  25 mg Oral TID PRN Rankin, Shuvon B, NP      . OLANZapine zydis (ZYPREXA) disintegrating tablet 5 mg  5 mg Oral Q8H PRN Aldean BakerSykes, Janet E, NP       And  . LORazepam (ATIVAN) tablet 1 mg  1 mg Oral PRN Aldean BakerSykes, Janet E, NP       And  . ziprasidone (GEODON) injection 20 mg  20 mg Intramuscular PRN  Aldean BakerSykes, Janet E, NP      . magnesium hydroxide (MILK OF MAGNESIA) suspension 30 mL  30 mL Oral Daily PRN Rankin, Shuvon B, NP      . metoprolol succinate (TOPROL-XL) 24 hr tablet 100 mg  100 mg Oral Daily Malvin JohnsFarah, Arie Gable, MD      . risperiDONE (RISPERDAL) tablet 1 mg  1 mg Oral Daily Money, Gerlene Burdockravis B, FNP   1 mg at 06/09/18 [id number]  . risperiDONE (RISPERDAL) tablet 5 mg  5 mg Oral QHS Money, Feliz Beamravis B, FNP      . temazepam (RESTORIL) capsule 30 mg  30 mg Oral QHS Malvin JohnsFarah, Deone Omahoney, MD      . traZODone (DESYREL) tablet 50 mg  50 mg Oral QHS PRN Rankin, Shuvon B, NP        Lab Results: No results found for this or any previous visit (from the past 48 hour(s)).  Blood Alcohol level:  Lab Results  Component Value Date   ETH <5 05/07/2016   ETH <5 03/15/2016    Metabolic Disorder Labs: No results found for: HGBA1C, MPG No results found for: PROLACTIN No results found for: CHOL, TRIG, HDL, CHOLHDL, VLDL, LDLCALC  Physical Findings: AIMS:  , ,  ,  ,    CIWA:    COWS:     Musculoskeletal: Strength & Muscle Tone: within normal limits Gait & Station: normal Patient leans: N/A  Psychiatric Specialty Exam: Physical Exam  ROS  Blood pressure (!) 156/106, pulse 76, temperature (!) 97.3 F (36.3 C), temperature source Oral, resp. rate 20, height 5\' 10"  (1.778 m), weight 60.8 kg, SpO2 92 %.Body mass index is 19.23 kg/m.  General Appearance: Casual  Eye Contact:  Good  Speech:  Clear and Coherent  Volume:  Normal  Mood:  Euthymic  Affect:  Appropriate  Thought Process:  Goal Directed and Descriptions of Associations: Loose  Orientation:  Full (Time, Place, and Person)  Thought Content:  Logical and Tangential  Suicidal Thoughts:  No  Homicidal Thoughts:  No  Memory:  Immediate;   Good  Judgement:  Fair  Insight:  Fair  Psychomotor Activity:  Normal  Concentration:  Concentration: Fair  Recall:  FiservFair  Fund of Knowledge:  Fair  Language:  Fair  Akathisia:  Negative  Handed:  Right  AIMS  (if indicated):     Assets:  Manufacturing systems engineerCommunication Skills Housing Leisure Time Physical Health Resilience Social Support  ADL's:  Intact  Cognition:  WNL  Sleep:  Number of Hours: 6.75     Treatment Plan Summary: Daily contact with patient to assess and evaluate symptoms and progress in treatment, Medication management and Plan We will give a test dose of aripiprazole but we will also discuss long-acting injectable again, this morning on rounds he refused long-acting injectable but I did discuss it with his mother and that seems to be the main hurdle keeping him here is that he will go home and not take meds were going to keep encouraging that.Malvin Johns, MD 06/09/2018, 10:52 AM

## 2018-06-09 NOTE — Progress Notes (Signed)
Patient approached another nurse and demanded to leave. Patient claimed someone told him he was discharging today. Patient was told he is not leaving until he takes his medications and takes his shot. Patient has been hanging out in the hallway near the safety exit.

## 2018-06-09 NOTE — Progress Notes (Signed)
D: Pt continues to be very flat and depressed on the unit today. Pt also continues to be very isolative. Pt did not attended , did not get up for snacks or medications. Pt refused his medications, not responding when spoken to. Pt reported being negative SI/HI, no AH/VH noted. A: 15 min checks continued for patient safety. R: Pts safety maintained.

## 2018-06-09 NOTE — Progress Notes (Signed)
Recreation Therapy Notes  Date:  6.1.20 Time: 0930 Location: 300 [address] Dayroom  Group Topic: Stress Management  Goal Area(s) Addresses:  Patient will identify positive stress management techniques. Patient will identify benefits of using stress management post d/c.  Intervention:  Stress Management  Activity :  Meditation.  LRT introduced the stress management technique of meditation.  LRT played a meditation that focused on make the most of your day.   Education:  Stress Management, Discharge Planning.   Education Outcome: Acknowledges Education  Clinical Observations/Feedback:  Pt did not attend group.    Madelena Maturin, LRT/CTRS         Farida Mcreynolds A 06/09/2018 11:19 AM 

## 2018-06-10 MED ORDER — ARIPIPRAZOLE ER 400 MG IM SRER
400.0000 mg | INTRAMUSCULAR | Status: DC
  Administered 2018-06-11: 400 mg via INTRAMUSCULAR

## 2018-06-10 NOTE — Progress Notes (Signed)
Pt stated he was not going to take the injection. Pt stated he will take medication. Pt has an issue with taking the injection.

## 2018-06-10 NOTE — Progress Notes (Signed)
D: Pt denies SI/HI/AVH. Pt is pleasant . Pt came to the medication to take his medication. Pt appeared to take his medication, but upon checking his mouth pt was trying to cheek the medication. Pt again said he was going to take it, but was walking away from the medication window. Pt again stated he swallowed the pills, but pills still seen in pt mouth. Pt informed to spit the pills out if he was not going to take them. Pt spit the dissolved pills in cup .   A: Pt was offered support and encouragement. Pt was encourage to attend groups. Q 15 minute checks were done for safety.   R:Pt attends groups and interacts  with peers and staff. Pt is not taking medication.Pt not receptive to treatment and safety maintained on unit .

## 2018-06-10 NOTE — Progress Notes (Addendum)
D:  Patient's self inventory sheet, patient sleeps good, no sleep medication.  Good appetite, normal energy level, good concentration.  Denied anxiety, depression and hopeless.  Denied withdrawals.  Denied SI.  Physical pain denied.  A:  Medications administered per MD orders.  Emotional support and encouragement given patient. R:  Patient denied SI and HI, contracts for safety.  Denied A/V hallucinations.  Safety maintained with 15 minute checks.  Patient has declined the ability IM several times today.  MD informed.

## 2018-06-10 NOTE — Plan of Care (Signed)
Nurse discussed anxiety, depression, coping skills with patient. 

## 2018-06-10 NOTE — Progress Notes (Signed)
D: Pt denies SI/HI/AVH. Pt minimal interaction with writer this evening. Pt been in room entire evening   A: Pt was offered support and encouragement. Pt was given scheduled medications. Pt was encourage to attend groups. Q 15 minute checks were done for safety.   R: safety maintained on unit.

## 2018-06-10 NOTE — Progress Notes (Signed)
Pt presented very confrontational, defiant and argumentative.
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
## 2018-06-10 NOTE — Progress Notes (Signed)
Pt attended spiritual care group on grief and loss facilitated by chaplain Remmi Armenteros  Group Goal:  Support / Education around grief and loss Members engage in facilitated group support and psycho social education.  Group Description:  Following introductions and group rules,  Group members engaged in facilitated group dialog and support around topic of loss, with particular support around experiences of loss in their lives. Group Identified types of loss (relationships / self / things) and identified patterns, circumstances, and changes that precipitate losses. Reflected on thoughts / feelings around loss, normalized grief responses, and recognized variety in grief experience. Patient Progress:  

## 2018-06-10 NOTE — Progress Notes (Signed)
Patient has been asked throughout the day to take this medication so he could be discharged.  MD informed of patient's refusal.

## 2018-06-10 NOTE — Progress Notes (Signed)
[facility] MD Progress Note  06/10/2018 10:34 AM Lance Rich  MRN:  [id number] Subjective:   Patient continues to refuse long-acting injectable and was only partially compliant over the last few days, fails to see the importance of compliance.  Again mother has emphasized that he simply will not take medications at home.  Again patient is refusing the shot but we basically are letting his discharge be contingent upon long-acting injectable due to chronic noncompliance and the fact he will probably re-present again if he does not comply at home.  He continues to deny positive symptoms. Principal Problem: Schizophrenia (HCC) Diagnosis: Principal Problem:   Schizophrenia (HCC) Active Problems:   Intermittent explosive disorder in adult  Total Time spent with patient: 20 minutes  Past Medical History: History reviewed. No pertinent past medical history. History reviewed. No pertinent surgical history. Family History:  Family History  Problem Relation Age of Onset  . Healthy Mother   . Healthy Father    Family Psychiatric  History: Father as discussed has psychosis Social History:  Social History   Substance and Sexual Activity  Alcohol Use No     Social History   Substance and Sexual Activity  Drug Use Not Currently  . Types: Marijuana    Social History   Socioeconomic History  . Marital status: Single    Spouse name: Not on file  . Number of children: Not on file  . Years of education: Not on file  . Highest education level: Not on file  Occupational History  . Not on file  Social Needs  . Financial resource strain: Not on file  . Food insecurity:    Worry: Not on file    Inability: Not on file  . Transportation needs:    Medical: Not on file    Non-medical: Not on file  Tobacco Use  . Smoking status: Never Smoker  . Smokeless tobacco: Never Used  Substance and Sexual Activity  . Alcohol use: No  . Drug use: Not Currently    Types: Marijuana  . Sexual activity: Not  on file  Lifestyle  . Physical activity:    Days per week: Not on file    Minutes per session: Not on file  . Stress: Not on file  Relationships  . Social connections:    Talks on phone: Not on file    Gets together: Not on file    Attends religious service: Not on file    Active member of club or organization: Not on file    Attends meetings of clubs or organizations: Not on file    Relationship status: Not on file  Other Topics Concern  . Not on file  Social History Narrative  . Not on file   Additional Social History:    Pain Medications: Please see MAR Prescriptions: Please see MAR Over the Counter: Please see MAR History of alcohol / drug use?: Yes Longest period of sobriety (when/how long): 2 years Name of Substance 1: Marijuana 1 - Age of First Use: 16 1 - Amount (size/oz): Varied 1 - Frequency: Varied 1 - Duration: Unknown 1 - Last Use / Amount: 2 years ago                  Sleep: Good  Appetite:  Good  Current Medications: Current Facility-Administered Medications  Medication Dose Route Frequency Provider Last Rate Last Dose  . acetaminophen (TYLENOL) tablet 650 mg  650 mg Oral Q6H PRN Rankin, Shuvon B, NP      .  alum & mag hydroxide-simeth (MAALOX/MYLANTA) 200-200-20 MG/5ML suspension 30 mL  30 mL Oral Q4H PRN Rankin, Shuvon B, NP      . ARIPiprazole (ABILIFY) tablet 2 mg  2 mg Oral Once Malvin Johns, MD      . benztropine (COGENTIN) tablet 0.5 mg  0.5 mg Oral BID Malvin Johns, MD   0.5 mg at 06/10/18 0630  . hydrOXYzine (ATARAX/VISTARIL) tablet 25 mg  25 mg Oral TID PRN Rankin, Shuvon B, NP      . OLANZapine zydis (ZYPREXA) disintegrating tablet 5 mg  5 mg Oral Q8H PRN Aldean Baker, NP       And  . LORazepam (ATIVAN) tablet 1 mg  1 mg Oral PRN Aldean Baker, NP       And  . ziprasidone (GEODON) injection 20 mg  20 mg Intramuscular PRN Aldean Baker, NP      . magnesium hydroxide (MILK OF MAGNESIA) suspension 30 mL  30 mL Oral Daily PRN Rankin,  Shuvon B, NP      . metoprolol succinate (TOPROL-XL) 24 hr tablet 100 mg  100 mg Oral Daily Malvin Johns, MD   100 mg at 06/10/18 0823  . risperiDONE (RISPERDAL) tablet 1 mg  1 mg Oral Daily Money, Gerlene Burdock, FNP   1 mg at 06/10/18 1601  . risperiDONE (RISPERDAL) tablet 5 mg  5 mg Oral QHS Money, Feliz Beam B, FNP      . temazepam (RESTORIL) capsule 30 mg  30 mg Oral QHS Malvin Johns, MD      . traZODone (DESYREL) tablet 50 mg  50 mg Oral QHS PRN Rankin, Shuvon B, NP        Lab Results: No results found for this or any previous visit (from the past 48 hour(s)).  Blood Alcohol level:  Lab Results  Component Value Date   ETH <5 05/07/2016   ETH <5 03/15/2016    Metabolic Disorder Labs: No results found for: HGBA1C, MPG No results found for: PROLACTIN No results found for: CHOL, TRIG, HDL, CHOLHDL, VLDL, LDLCALC  Physical Findings: AIMS:  , ,  ,  ,    CIWA:    COWS:     Musculoskeletal: Strength & Muscle Tone: within normal limits Gait & Station: normal Patient leans: N/A  Psychiatric Specialty Exam: Physical Exam  ROS  Blood pressure (!) 156/106, pulse 76, temperature (!) 97.3 F (36.3 C), temperature source Oral, resp. rate 20, height 5\' 10"  (1.778 m), weight 60.8 kg, SpO2 92 %.Body mass index is 19.23 kg/m.  General Appearance: Casual and Disheveled  Eye Contact:  Good  Speech:  Clear and Coherent  Volume:  Normal  Mood:  Euthymic  Affect:  Congruent  Thought Process:  Linear and Descriptions of Associations: Loose  Orientation:  Full (Time, Place, and Person)  Thought Content:  Illogical and Tangential  Suicidal Thoughts:  No  Homicidal Thoughts:  No  Memory:  Immediate;   Fair  Judgement:  Impaired  Insight:  Lacking  Psychomotor Activity:  Normal  Concentration:  Concentration: Fair  Recall:  [employer] of Knowledge:  Fair  Language:  Fair  Akathisia:  Negative  Handed:  Right  AIMS (if indicated):     Assets:  Physical Health Resilience  ADL's:  Intact   Cognition:  WNL  Sleep:  Number of Hours: 6.5     Treatment Plan Summary: Daily contact with patient to assess and evaluate symptoms and progress in treatment, Medication management and Plan  Continue cognitive and reality based therapies continue to encourage compliance and long-acting injectable no change in dosing though  Malvin JohnsFARAH,Lillieanna Tuohy, MD 06/10/2018, 10:34 AM
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
## 2018-06-11 MED ORDER — RISPERIDONE 3 MG PO TABS
6.0000 mg | ORAL_TABLET | Freq: Every day | ORAL | Status: DC
  Administered 2018-06-11: 6 mg via ORAL
  Filled 2018-06-11: qty 2
  Filled 2018-06-11: qty 14
  Filled 2018-06-11 (×2): qty 2

## 2018-06-11 MED ORDER — RISPERIDONE 2 MG PO TABS
2.0000 mg | ORAL_TABLET | Freq: Every day | ORAL | Status: DC
  Administered 2018-06-12: 2 mg via ORAL
  Filled 2018-06-11 (×3): qty 1

## 2018-06-11 NOTE — Progress Notes (Signed)
[facility] MD Progress Note  06/11/2018 9:29 AM Lance Rich  MRN:  [id number] Subjective:    Patient continues to insist upon discharge and yet refuses long-acting injectable medication which is a key issue as his mother fears that he will simply relapse and need rehospitalization without it.  Patient then got on the phone was tried to call [name] so he is still delusional and grandiose and still needs antipsychotic medications.  Principal Problem: Schizophrenia (HCC) Diagnosis: Principal Problem:   Schizophrenia (HCC) Active Problems:   Intermittent explosive disorder in adult  Total Time spent with patient: 20 minutes  Past Medical History: History reviewed. No pertinent past medical history. History reviewed. No pertinent surgical history. Family History:  Family History  Problem Relation Age of Onset  . Healthy Mother   . Healthy Father    Family Psychiatric  History: neg Social History:  Social History   Substance and Sexual Activity  Alcohol Use No     Social History   Substance and Sexual Activity  Drug Use Not Currently  . Types: Marijuana    Social History   Socioeconomic History  . Marital status: Single    Spouse name: Not on file  . Number of children: Not on file  . Years of education: Not on file  . Highest education level: Not on file  Occupational History  . Not on file  Social Needs  . Financial resource strain: Not on file  . Food insecurity:    Worry: Not on file    Inability: Not on file  . Transportation needs:    Medical: Not on file    Non-medical: Not on file  Tobacco Use  . Smoking status: Never Smoker  . Smokeless tobacco: Never Used  Substance and Sexual Activity  . Alcohol use: No  . Drug use: Not Currently    Types: Marijuana  . Sexual activity: Not on file  Lifestyle  . Physical activity:    Days per week: Not on file    Minutes per session: Not on file  . Stress: Not on file  Relationships  . Social connections:   Talks on phone: Not on file    Gets together: Not on file    Attends religious service: Not on file    Active member of club or organization: Not on file    Attends meetings of clubs or organizations: Not on file    Relationship status: Not on file  Other Topics Concern  . Not on file  Social History Narrative  . Not on file   Additional Social History:    Pain Medications: Please see MAR Prescriptions: Please see MAR Over the Counter: Please see MAR History of alcohol / drug use?: Yes Longest period of sobriety (when/how long): 2 years Name of Substance 1: Marijuana 1 - Age of First Use: 16 1 - Amount (size/oz): Varied 1 - Frequency: Varied 1 - Duration: Unknown 1 - Last Use / Amount: 2 years ago                  Sleep: Good  Appetite:  Good  Current Medications: Current Facility-Administered Medications  Medication Dose Route Frequency Provider Last Rate Last Dose  . acetaminophen (TYLENOL) tablet 650 mg  650 mg Oral Q6H PRN Rankin, Shuvon B, NP      . alum & mag hydroxide-simeth (MAALOX/MYLANTA) 200-200-20 MG/5ML suspension 30 mL  30 mL Oral Q4H PRN Rankin, Shuvon B, NP      . ARIPiprazole (  ABILIFY) tablet 2 mg  2 mg Oral Once Malvin Johns, MD      . ARIPiprazole ER (ABILIFY MAINTENA) injection 400 mg  400 mg Intramuscular Q28 days Malvin Johns, MD      . benztropine (COGENTIN) tablet 0.5 mg  0.5 mg Oral BID Malvin Johns, MD   0.5 mg at 06/11/18 0756  . hydrOXYzine (ATARAX/VISTARIL) tablet 25 mg  25 mg Oral TID PRN Rankin, Shuvon B, NP      . OLANZapine zydis (ZYPREXA) disintegrating tablet 5 mg  5 mg Oral Q8H PRN Aldean Baker, NP       And  . LORazepam (ATIVAN) tablet 1 mg  1 mg Oral PRN Aldean Baker, NP       And  . ziprasidone (GEODON) injection 20 mg  20 mg Intramuscular PRN Aldean Baker, NP      . magnesium hydroxide (MILK OF MAGNESIA) suspension 30 mL  30 mL Oral Daily PRN Rankin, Shuvon B, NP      . metoprolol succinate (TOPROL-XL) 24 hr tablet 100  mg  100 mg Oral Daily Malvin Johns, MD   100 mg at 06/11/18 0756  . risperiDONE (RISPERDAL) tablet 1 mg  1 mg Oral Daily Money, Gerlene Burdock, FNP   1 mg at 06/11/18 0756  . risperiDONE (RISPERDAL) tablet 5 mg  5 mg Oral QHS Money, Feliz Beam B, FNP      . temazepam (RESTORIL) capsule 30 mg  30 mg Oral QHS Malvin Johns, MD      . traZODone (DESYREL) tablet 50 mg  50 mg Oral QHS PRN Rankin, Shuvon B, NP        Lab Results: No results found for this or any previous visit (from the past 48 hour(s)).  Blood Alcohol level:  Lab Results  Component Value Date   ETH <5 05/07/2016   ETH <5 03/15/2016    Metabolic Disorder Labs: No results found for: HGBA1C, MPG No results found for: PROLACTIN No results found for: CHOL, TRIG, HDL, CHOLHDL, VLDL, LDLCALC  Physical Findings: AIMS: Facial and Oral Movements Muscles of Facial Expression: None, normal Lips and Perioral Area: None, normal Jaw: None, normal Tongue: None, normal,Extremity Movements Upper (arms, wrists, hands, fingers): None, normal Lower (legs, knees, ankles, toes): None, normal, Trunk Movements Neck, shoulders, hips: None, normal, Overall Severity Severity of abnormal movements (highest score from questions above): None, normal Incapacitation due to abnormal movements: None, normal Patient's awareness of abnormal movements (rate only patient's report): No Awareness, Dental Status Current problems with teeth and/or dentures?: No Does patient usually wear dentures?: No  CIWA:  CIWA-Ar Total: 1 COWS:  COWS Total Score: 2  Musculoskeletal: Strength & Muscle Tone: within normal limits Gait & Station: normal Patient leans: N/A  Psychiatric Specialty Exam: Physical Exam  ROS  Blood pressure (!) 157/81, pulse 93, temperature 97.6 F (36.4 C), temperature source Oral, resp. rate 18, height  (1.778 m), weight 60.8 kg, SpO2 92 %.Body mass index is 19.23 kg/m.  General Appearance: Casual  Eye Contact:  Good  Speech:  Clear and  Coherent  Volume:  Increased  Mood:  Irritable  Affect: Congruent  Thought Process:  Irrelevant and Descriptions of Associations: Loose  Orientation:  Full (Time, Place, and Person)  Thought Content:  Illogical and Delusions  Suicidal Thoughts:  No  Homicidal Thoughts:  No  Memory:  Immediate;   Fair  Judgement:  poor  Insight:  Lacking  Psychomotor Activity:  Normal  Concentration:  Concentration:  Fair  Recall:  [employer]is[employer]Fair  Fund of Knowledge:  Fair  Language:  Fair  Akathisia:  Negative  Handed:  Right  AIMS (if indicated):     Assets:  Leisure Time Physical Health Resilience  ADL's:  Intact  Cognition:  WNL  Sleep:  Number of Hours: 6.5     Treatment Plan Summary: Daily contact with patient to assess and evaluate symptoms and progress in treatment and Medication management patient will need forced medication consult in order to administer long-acting injectable remains to delusional.  Released home and will not comply with meds at home  [facility]venthealth TampaFARAH,Markus Casten, MD 06/11/2018, 9:29 AM

## 2018-06-11 NOTE — Tx Team (Signed)
Interdisciplinary Treatment and Diagnostic Plan Update  06/11/2018 Time of Session: 4383 Dayvion Sluis MRN: [id number]  Principal Diagnosis: Schizophrenia [facility])  Secondary Diagnoses: Principal Problem:   Schizophrenia (HCC) Active Problems:   Intermittent explosive disorder in adult   Current Medications:  Current Facility-Administered Medications  Medication Dose Route Frequency Provider Last Rate Last Dose  . acetaminophen (TYLENOL) tablet 650 mg  650 mg Oral Q6H PRN Rankin, Shuvon B, NP      . alum & mag hydroxide-simeth (MAALOX/MYLANTA) 200-200-20 MG/5ML suspension 30 mL  30 mL Oral Q4H PRN Rankin, Shuvon B, NP      . ARIPiprazole (ABILIFY) tablet 2 mg  2 mg Oral Once Malvin Johns, MD      . ARIPiprazole ER (ABILIFY MAINTENA) injection 400 mg  400 mg Intramuscular Q28 days Malvin Johns, MD      . benztropine (COGENTIN) tablet 0.5 mg  0.5 mg Oral BID Malvin Johns, MD   0.5 mg at 06/11/18 0756  . hydrOXYzine (ATARAX/VISTARIL) tablet 25 mg  25 mg Oral TID PRN Rankin, Shuvon B, NP      . OLANZapine zydis (ZYPREXA) disintegrating tablet 5 mg  5 mg Oral Q8H PRN Aldean Baker, NP       And  . LORazepam (ATIVAN) tablet 1 mg  1 mg Oral PRN Aldean Baker, NP       And  . ziprasidone (GEODON) injection 20 mg  20 mg Intramuscular PRN Aldean Baker, NP      . magnesium hydroxide (MILK OF MAGNESIA) suspension 30 mL  30 mL Oral Daily PRN Rankin, Shuvon B, NP      . metoprolol succinate (TOPROL-XL) 24 hr tablet 100 mg  100 mg Oral Daily Malvin Johns, MD   100 mg at 06/11/18 0756  . [START ON 06/12/2018] risperiDONE (RISPERDAL) tablet 2 mg  2 mg Oral Daily Malvin Johns, MD      . risperiDONE (RISPERDAL) tablet 6 mg  6 mg Oral QHS Malvin Johns, MD      . temazepam (RESTORIL) capsule 30 mg  30 mg Oral QHS Malvin Johns, MD      . traZODone (DESYREL) tablet 50 mg  50 mg Oral QHS PRN Rankin, Shuvon B, NP       PTA Medications: Medications Prior to Admission  Medication Sig Dispense Refill Last  Dose  . escitalopram (LEXAPRO) 10 MG tablet Take 10 mg by mouth daily.   Past Month at Unknown time  . hydrOXYzine (ATARAX/VISTARIL) 25 MG tablet Take 25 mg by mouth every 6 (six) hours as needed for anxiety.   Past Month at Unknown time    Patient Stressors: Educational concerns Marital or family conflict  Patient Strengths: Barrister's clerk for treatment/growth Supportive family/friends  Treatment Modalities: Medication Management, Group therapy, Case management,  1 to 1 session with clinician, Psychoeducation, Recreational therapy.   Physician Treatment Plan for Primary Diagnosis: Schizophrenia (HCC) Long Term Goal(s): Improvement in symptoms so as ready for discharge Improvement in symptoms so as ready for discharge   Short Term Goals: Ability to identify changes in lifestyle to reduce recurrence of condition will improve Ability to verbalize feelings will improve Ability to demonstrate self-control will improve Ability to identify and develop effective coping behaviors will improve Compliance with prescribed medications will improve  Medication Management: Evaluate patient's response, side effects, and tolerance of medication regimen.  Therapeutic Interventions: 1 to 1 sessions, Unit Group sessions and Medication administration.  Evaluation of Outcomes: Progressing  Physician Treatment Plan  for Secondary Diagnosis: Principal Problem:   Schizophrenia (HCC) Active Problems:   Intermittent explosive disorder in adult  Long Term Goal(s): Improvement in symptoms so as ready for discharge Improvement in symptoms so as ready for discharge   Short Term Goals: Ability to identify changes in lifestyle to reduce recurrence of condition will improve Ability to verbalize feelings will improve Ability to demonstrate self-control will improve Ability to identify and develop effective coping behaviors will improve Compliance with prescribed medications will improve      Medication Management: Evaluate patient's response, side effects, and tolerance of medication regimen.  Therapeutic Interventions: 1 to 1 sessions, Unit Group sessions and Medication administration.  Evaluation of Outcomes: Progressing   RN Treatment Plan for Primary Diagnosis: Schizophrenia (HCC) Long Term Goal(s): Knowledge of disease and therapeutic regimen to maintain health will improve  Short Term Goals: Ability to identify and develop effective coping behaviors will improve and Compliance with prescribed medications will improve  Medication Management: RN will administer medications as ordered by provider, will assess and evaluate patient's response and provide education to patient for prescribed medication. RN will report any adverse and/or side effects to prescribing provider.  Therapeutic Interventions: 1 on 1 counseling sessions, Psychoeducation, Medication administration, Evaluate responses to treatment, Monitor vital signs and CBGs as ordered, Perform/monitor CIWA, COWS, AIMS and Fall Risk screenings as ordered, Perform wound care treatments as ordered.  Evaluation of Outcomes: Progressing   LCSW Treatment Plan for Primary Diagnosis: Schizophrenia (HCC) Long Term Goal(s): Safe transition to appropriate next level of care at discharge, Engage patient in therapeutic group addressing interpersonal concerns.  Short Term Goals: Engage patient in aftercare planning with referrals and resources, Increase social support and Increase skills for wellness and recovery  Therapeutic Interventions: Assess for all discharge needs, 1 to 1 time with Social worker, Explore available resources and support systems, Assess for adequacy in community support network, Educate family and significant other(s) on suicide prevention, Complete Psychosocial Assessment, Interpersonal group therapy.  Evaluation of Outcomes: Progressing   Progress in Treatment: Attending groups: No. Participating in  groups: No. Taking medication as prescribed: No. and As evidenced by:  cheeking medications. Refusing meds Toleration medication: Yes. Family/Significant other contact made: No, will contact:  parents Patient understands diagnosis: Yes. Discussing patient identified problems/goals with staff: Yes. Medical problems stabilized or resolved: Yes. Denies suicidal/homicidal ideation: Yes. Issues/concerns per patient self-inventory: No. Other: none  New problem(s) identified: No, Describe:  none  New Short Term/Long Term Goal(s):  Patient Goals:  "better mental health"  Discharge Plan or Barriers: Patient is recommended to accept and injectable medication prior to discharge. [facility]  Reason for Continuation of Hospitalization: Delusions  Medication stabilization  Estimated Length of Stay:1-2 days  Attendees: Patient:Wyatt Elboshra 06/06/2018   Physician: Dr. Jeannine KittenFarah, MD 06/06/2018   Nursing: Norma FredricksonMichael Scearce, RN 06/06/2018   RN Care Manager: 06/06/2018   Social Worker: Daleen SquibbGreg Wierda, LCSW Emelleharlotte Janney Priego, LCSWA 06/06/2018   Recreational Therapist:  06/06/2018   Other:  06/06/2018   Other:  06/06/2018   Other: 06/06/2018      Scribe for Treatment Team: Darreld Mcleanharlotte C Jules Vidovich, LCSWA 06/11/2018 10:41 AM

## 2018-06-11 NOTE — [facility] Group Notes (Signed)
Occupational Therapy Group Note  Date:  06/11/2018 Time:  1:15 PM  Group Topic/Focus:  Self Esteem Action Plan:   The focus of this group is to help patients create a plan to continue to build self-esteem after discharge.  Participation Level:  Minimal  Participation Quality:  Inattentive  Affect:  Flat  Cognitive:  Alert  Insight: Lacking  Engagement in Group:  Limited  Modes of Intervention:  Activity, Discussion, Education and Socialization  Additional Comments:    S: none stated  O: OT tx with focus on self esteem building this date. Education given on definition of self esteem, with both causes of low and high self esteem identified. Activity given for pt to identify a positive/aspiring trait for each letter of the alphabet. Pt to work with peers to help complete activity and build positive thinking.   A: Pt came late to group, did not participate and sat drawing on table with pencil for enitrety.  P: OT group will be x1 per week while pt inpatient  Dalphine Handing, MSOT, OTR/L Behavioral Health OT/ Acute Relief OT PHP Office: [phone]  Dalphine Handing 06/11/2018, 1:15 PM
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
## 2018-06-11 NOTE — Plan of Care (Signed)
  Problem: Activity: Goal: Interest or engagement in activities will improve Outcome: Progressing   
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
## 2018-06-12 MED ORDER — RISPERIDONE 3 MG PO TABS: 6.0000 mg | ORAL_TABLET | Freq: Every day | ORAL | 3 refills | Status: DC

## 2018-06-12 MED ORDER — ARIPIPRAZOLE ER 400 MG IM SRER: 400.0000 mg | INTRAMUSCULAR | 11 refills | Status: DC

## 2018-06-12 MED ORDER — BENZTROPINE MESYLATE 1 MG PO TABS
1.0000 mg | ORAL_TABLET | Freq: Two times a day (BID) | ORAL | Status: DC
  Filled 2018-06-12 (×3): qty 14

## 2018-06-12 MED ORDER — BENZTROPINE MESYLATE 1 MG PO TABS: 1.0000 mg | ORAL_TABLET | Freq: Two times a day (BID) | ORAL | 2 refills | Status: DC

## 2018-06-12 MED ORDER — TEMAZEPAM 30 MG PO CAPS: 30.0000 mg | ORAL_CAPSULE | Freq: Every day | ORAL | 0 refills | Status: DC

## 2018-06-12 MED ORDER — METOPROLOL SUCCINATE ER 100 MG PO TB24: 100.0000 mg | ORAL_TABLET | Freq: Every day | ORAL | 2 refills | Status: DC
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
## 2018-06-12 NOTE — Plan of Care (Signed)
  Problem: Activity: Goal: Interest or engagement in activities will improve Outcome: Progressing   Problem: Coping: Goal: Coping ability will improve Outcome: Progressing   

## 2018-06-12 NOTE — [facility] Suicide Risk Assessment (Signed)
[facility] INPATIENT:  Family/Significant Other Suicide Prevention Education  Suicide Prevention Education:  Patient Refusal for Family/Significant Other Suicide Prevention Education: The patient Lance Rich has refused to provide written consent for family/significant other to be provided Family/Significant Other Suicide Prevention Education during admission and/or prior to discharge.  Physician notified.  SPE completed with patient, as patient refused to consent to family contact. SPI pamphlet provided to pt and pt was encouraged to share information with support network, ask questions, and talk about any concerns relating to SPE. Patient denies access to guns/firearms and verbalized understanding of information provided. Mobile Crisis information also provided to patient.    [name] 06/12/2018, 11:36 AM

## 2018-06-12 NOTE — Progress Notes (Signed)
  [facility] Adult Case Management Discharge Plan :  Will you be returning to the same living situation after discharge:  Yes,  patient reports he is returning home At discharge, do you have transportation home?: Yes,  taxi voucher Do you have the ability to pay for your medications: No.  Release of information consent forms completed and in the chart;  Patient's signature needed at discharge.  Patient to Follow up at: Follow-up Information    [facility] Follow up on 06/13/2018.   Why:  Hospital follow up appointment is Friday, 6/5 at 9:30a.  Appointment will be held over the phone and the provider will contact you.  Contact information: [address] [place] [address] [phone]           Next level of care provider has access to [facility]:yes  Safety Planning and Suicide Prevention discussed: Yes,  with the patient      Has patient been referred to the Quitline?: N/A patient is not a smoker  Patient has been referred for addiction treatment: N/A  [name], LCSWA 06/12/2018, 11:36 AM

## 2018-06-12 NOTE — Progress Notes (Signed)
Patient ID: Lance Rich, male   DOB: [birth year], 18 y.o.   MRN: [id number]   Pt did not attend morning orientation group with the [facility]. Pt was informed group was taking place, and pt chose not to attend.

## 2018-06-12 NOTE — Progress Notes (Signed)
Discharge note  Patient verbalizes readiness for discharge. Follow up plan explained, AVS, Transition record and SRA given. Prescriptions and teaching provided. Belongings returned and signed for. Suicide safety plan completed and signed. Patient verbalizes understanding. Patient denies SI/HI and assures this Clinical research associate he will seek assistance should that change. Patient discharged to lobby where taxi was waiting.
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
## 2018-06-12 NOTE — Progress Notes (Signed)
DAR: Pt in the dayroom with peers at th beginning of the shift. Pt reported his concern over IM which was given during th previous shift. Pt stated he does not understand why he was given the injection while he has been taking his medication. Pt also concerned about the bruises on his neck and wanted to know it if was documented.  Pt denies physical pain, took all his meds as scheduled. Pt rate depression at 0, hopeless ness at 0. Pt's safety ensured with 15 minute and environmental checks. Pt currently denies SI/HI and A/V hallucinations. Pt verbally agrees to seek staff if SI/HI or A/VH occurs and to consult with staff before acting on these thoughts. Will continue POC.

## 2018-06-12 NOTE — [facility]HH Suicide Risk Assessment (Signed)
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 rate 16, height 5\' 10"  (1.778 m), weight 60.8 kg, SpO2 92 %.Body mass index is 19.23 kg/m.  General Appearance: Casual  Eye Contact::  Good  Speech:  Clear and Coherent409  Volume:  Normal  Mood:  Irritable  Affect:  Full Range  Thought Process:  Coherent and Linear  Orientation:  Full (Time, Place, and Person)  Thought Content:  Logical and Tangential  Suicidal Thoughts:  No  Homicidal Thoughts:  No  Memory:  Immediate;   Fair  Judgement:  Fair  Insight:  Shallow  Psychomotor Activity:  Normal  Concentration:  Fair  Recall:  [employer] of Knowledge:Fair  Language: Fair  Akathisia:  Negative  Handed:  Right  AIMS (if indicated):     Assets:  Communication Skills Desire for Improvement  Sleep:  Number of Hours: 4.5  Cognition: WNL  ADL's:  Intact   Mental Status Per Nursing Assessment::   On Admission:     Demographic Factors:  Male  Loss Factors: Decrease in vocational status  Historical Factors: Impulsivity  Risk Reduction Factors:   Positive social support  Continued Clinical Symptoms:  Schizophrenia:   Paranoid or undifferentiated type  Cognitive Features That Contribute To Risk:  Loss of executive function    Suicide Risk:  Minimal: No identifiable suicidal ideation.  Patients presenting with no risk factors but with morbid ruminations; may be classified as minimal risk based on the severity of the depressive symptoms  Follow-up Information    [facility] Follow up on  06/13/2018.   Why:  Hospital follow up appointment is Friday, 6/5 at 9:30a.  Appointment will be held over the phone and the provider will contact you.  Contact information: [address] [place] [address] [phone]           Plan Of Care/Follow-up recommendations:  Activity:  full  Kniyah Khun, MD 06/12/2018, 9:05 AM

## 2019-01-09 ENCOUNTER — Encounter (HOSPITAL_COMMUNITY): Payer: Self-pay

## 2019-01-09 ENCOUNTER — Ambulatory Visit (HOSPITAL_COMMUNITY)
Admission: EM | Admit: 2019-01-09 | Discharge: 2019-01-09 | Disposition: A | Discharge status: Home / Self Care | Payer: HRSA Program | Attending: Physician Assistant | Admitting: Physician Assistant

## 2019-01-09 ENCOUNTER — Other Ambulatory Visit: Payer: Self-pay

## 2019-01-09 DIAGNOSIS — B309 Viral conjunctivitis, unspecified: Secondary | ICD-10-CM | POA: Diagnosis present

## 2019-01-09 DIAGNOSIS — Z20822 Contact with and (suspected) exposure to covid-19: Secondary | ICD-10-CM | POA: Diagnosis present

## 2019-01-09 DIAGNOSIS — B349 Viral infection, unspecified: Secondary | ICD-10-CM | POA: Diagnosis present

## 2019-01-09 MED ORDER — ONDANSETRON HCL 4 MG PO TABS: 4.0000 mg | ORAL_TABLET | Freq: Three times a day (TID) | ORAL | 0 refills | Status: DC | PRN

## 2019-01-09 MED ORDER — OLOPATADINE HCL 0.1 % OP SOLN: 1.0000 [drp] | Freq: Two times a day (BID) | OPHTHALMIC | 12 refills | Status: DC

## 2019-01-09 NOTE — ED Triage Notes (Signed)
Pt presents to UC with nausea, swollen eyes, fever and SOB x 4-5 days.

## 2019-01-09 NOTE — ED Provider Notes (Signed)
[facility]    CSN: [id number] Arrival date & time: 01/09/19  1831      History   Chief Complaint Chief Complaint  Patient presents with  . Fever  . Nausea  . Eye Problem  . Shortness of Breath    HPI Lance Rich is a 19 y.o. male.   Patient reports to urgent care today for 5 days of fever, nausea and eye discharge/redness. He reports starting 5 days ago he started to feel feverish , then developed nausea, a mild cough, headache and noticed his Left eye was red and had discharge. The redness and discharge began in his right eye the next day. He reports waking up with his eyes crusted over but this clears throughout the day and does not return. He reports itching but no change in vision. His nausea is worse when eating, which has reduced his appetite. He reports fevers in the 100s, and has taken tylenol for these. He took tylenol about 2 hours before coming to clinic. He reported 1 episode of shortness of breath but reports going back to sleep and no longer feeling that way. He has not had shortness of breath since that moment. He denies chest pain, abdominal pain, diarrhea, change or loss of smell or taste.   He has no known sick contacts.      History reviewed. No pertinent past medical history.  Patient Active Problem List   Diagnosis Date Noted  . Intermittent explosive disorder in adult 06/04/2018  . Schizophrenia (HCC) 06/04/2018  . 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 Intermittent explosive disorder 03/16/2016  . 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 No pertinent surgical history.     Home Medications    Prior to Admission medications   Medication Sig Start Date End Date Taking? Authorizing Provider  ARIPiprazole ER (ABILIFY MAINTENA) 400 MG SRER injection Inject 2 mLs (400 mg total) into the muscle every 28 (twenty-eight) days. Next due 07/11/2018 07/08/18    Malvin Johns, MD  benztropine (COGENTIN) 1 MG tablet Take 1 tablet (1 mg total) by mouth 2 (two) times daily. 06/12/18   Malvin Johns, MD  metoprolol succinate (TOPROL-XL) 100 MG 24 hr tablet Take 1 tablet (100 mg total) by mouth daily. Take with or immediately following a meal. 06/12/18   Malvin Johns, MD  olopatadine (PATADAY) 0.1 % ophthalmic solution Place 1 drop into both eyes 2 (two) times daily. 01/09/19   Dino Borntreger, Veryl Speak, PA-C  ondansetron (ZOFRAN) 4 MG tablet Take 1 tablet (4 mg total) by mouth every 8 (eight) hours as needed for nausea or vomiting. 01/09/19   Jaeleigh Monaco, Veryl Speak, PA-C  risperiDONE (RISPERDAL) 3 MG tablet Take 2 tablets (6 mg total) by mouth at bedtime. 06/12/18   Malvin Johns, MD  temazepam (RESTORIL) 30 MG capsule Take 1 capsule (30 mg total) by mouth at bedtime. 06/12/18   Malvin Johns, MD    Family History Family History  Problem Relation Age of Onset  . Healthy Mother   . Healthy Father     Social History Social History   Tobacco Use  . Smoking status: Never Smoker  . Smokeless tobacco: Never Used  Substance Use Topics  . Alcohol use: No  . Drug use: Not Currently    Types: Marijuana     Allergies   Patient has no known allergies.   Review of Systems Review of Systems  Constitutional: Positive for appetite change,  chills and fever.  HENT: Positive for rhinorrhea. Negative for congestion, ear pain, sinus pressure, sinus pain and sore throat.   Eyes: Positive for discharge, redness and itching. Negative for photophobia, pain and visual disturbance.  Respiratory: Positive for cough. Negative for shortness of breath and wheezing.   Cardiovascular: Negative for chest pain and palpitations.  Gastrointestinal: Positive for nausea. Negative for abdominal pain, constipation, diarrhea and vomiting.  Genitourinary: Negative for dysuria and hematuria.  Musculoskeletal: Positive for myalgias. Negative for arthralgias and back pain.  Skin: Negative for color change and rash.   Neurological: Positive for headaches. Negative for dizziness, syncope and numbness.  Hematological: Negative for adenopathy.  All other systems reviewed and are negative.    Physical Exam Triage Vital Signs ED Triage Vitals  Enc Vitals Group     BP 01/09/19 1910 (!) 145/83     Pulse Rate 01/09/19 1910 88     Resp 01/09/19 1910 16     Temp 01/09/19 1910 98.1 F (36.7 C)     Temp Source 01/09/19 1910 Oral     SpO2 01/09/19 1910 97 %     Weight --      Height --      Head Circumference --      Peak Flow --      Pain Score 01/09/19 1909 0     Pain Loc --      Pain Edu? --      Excl. in [place]? --    No data found.  Updated Vital Signs BP (!) 145/83 (BP Location: Right Arm)   Pulse 88   Temp 98.1 F (36.7 C) (Oral)   Resp 16   SpO2 97%   Visual Acuity Right Eye Distance:   Left Eye Distance:   Bilateral Distance:    Right Eye Near:   Left Eye Near:    Bilateral Near:     Physical Exam Vitals and nursing note reviewed.  Constitutional:      Appearance: He is well-developed and normal weight. He is ill-appearing.  HENT:     Head: Normocephalic and atraumatic.     Mouth/Throat:     Mouth: Mucous membranes are moist.     Pharynx: No pharyngeal swelling or oropharyngeal exudate.  Eyes:     Extraocular Movements: Extraocular movements intact.     Conjunctiva/sclera:     Right eye: Right conjunctiva is injected. Exudate present.     Left eye: Left conjunctiva is injected. Exudate present.     Pupils: Pupils are equal, round, and reactive to light.     Comments: Exudate is mild and isolated to the corner margin of the eye  Cardiovascular:     Rate and Rhythm: Normal rate and regular rhythm.     Heart sounds: No murmur. No gallop.   Pulmonary:     Effort: Pulmonary effort is normal. No tachypnea or respiratory distress.     Breath sounds: Normal breath sounds.  Abdominal:     Palpations: Abdomen is soft.     Tenderness: There is no abdominal tenderness.   Musculoskeletal:     Cervical back: Neck supple.     Right lower leg: No edema.     Left lower leg: No edema.  Lymphadenopathy:     Cervical: No cervical adenopathy.  Skin:    General: Skin is warm and dry.  Neurological:     General: No focal deficit present.     Mental Status: He is alert and oriented to person,  place, and time.  Psychiatric:        Mood and Affect: Mood normal.        Behavior: Behavior normal.      UC Treatments / Results  Labs (all labs ordered are listed, but only abnormal results are displayed) Labs Reviewed  NOVEL CORONAVIRUS, NAA (HOSP ORDER, SEND-OUT TO REF LAB; TAT 18-24 HRS)    EKG   Radiology No results found.  Procedures Procedures (including critical care time)  Medications Ordered in UC Medications - No data to display  Initial Impression / Assessment and Plan / UC Course  I have reviewed the triage vital signs and the nursing notes.  Pertinent labs & imaging results that were available during my care of the patient were reviewed by me and considered in my medical decision making (see chart for details).     #Viral illness #conjunctivitis - Covid PCR sent. Suspect viral etiology for all, consider adenovirus due to respiratory, GI and conjunctivitis. Symptomatic treatment with anti-histamine drops and zofran for nausea. Discussed ways to limit spread and covid instructions should it be positive. Return and follow up discussed.    Final Clinical Impressions(s) / UC Diagnoses   Final diagnoses:  Encounter for laboratory testing for COVID-19 virus  Viral illness  Acute viral conjunctivitis of both eyes     Discharge Instructions     Use the zofran up to every 8 hours as needed for nausea  Instill 1 drop 2 times a day for relief of the itchy eyes. Avoid spreading by not scratching your eyes.  If you are not improving in 1 week, please follow up with your Primary care or return to this clinic.   If your Covid-19 test is  positive, you will receive a phone call from [facility] regarding your results. Negative test results are not called. Both positive and negative results area always visible on MyChart. If you do not have a MyChart account, sign up instructions are in your discharge papers.   Persons who are directed to care for themselves at home may discontinue isolation under the following conditions:  . At least 10 days have passed since symptom onset and . At least 24 hours have passed without running a fever (this means without the use of fever-reducing medications) and . Other symptoms have improved.  Persons infected with COVID-19 who never develop symptoms may discontinue isolation and other precautions 10 days after the date of their first positive COVID-19 test.     ED Prescriptions    Medication Sig Dispense Auth. Provider   olopatadine (PATADAY) 0.1 % ophthalmic solution Place 1 drop into both eyes 2 (two) times daily. 5 mL Grayling Schranz, Veryl Speak, PA-C   ondansetron (ZOFRAN) 4 MG tablet  (Status: Discontinued) Take 1 tablet (4 mg total) by mouth every 8 (eight) hours as needed for nausea or vomiting. 4 tablet Rabab Currington, Veryl Speak, PA-C   ondansetron (ZOFRAN) 4 MG tablet Take 1 tablet (4 mg total) by mouth every 8 (eight) hours as needed for nausea or vomiting. 4 tablet Terrence Wishon, Veryl Speak, PA-C     PDMP not reviewed this encounter.   Hermelinda Medicus, PA-C 01/09/19 2002

## 2019-01-09 NOTE — Discharge Instructions (Addendum)
Use the zofran up to every 8 hours as needed for nausea  Instill 1 drop 2 times a day for relief of the itchy eyes. Avoid spreading by not scratching your eyes.  If you are not improving in 1 week, please follow up with your Primary care or return to this clinic.   If your Covid-19 test is positive, you will receive a phone call from [facility] regarding your results. Negative test results are not called. Both positive and negative results area always visible on MyChart. If you do not have a MyChart account, sign up instructions are in your discharge papers.   Persons who are directed to care for themselves at home may discontinue isolation under the following conditions:   At least 10 days have passed since symptom onset and  At least 24 hours have passed without running a fever (this means without the use of fever-reducing medications) and  Other symptoms have improved.  Persons infected with COVID-19 who never develop symptoms may discontinue isolation and other precautions 10 days after the date of their first positive COVID-19 test.

## 2019-01-11 LAB — NOVEL CORONAVIRUS, NAA (HOSP ORDER, SEND-OUT TO REF LAB; TAT 18-24 HRS): SARS-CoV-2, NAA: NOT DETECTED

## 2019-01-12 ENCOUNTER — Telehealth: Payer: Self-pay

## 2019-01-12 NOTE — Telephone Encounter (Signed)
Received call from patient checking Covid results.  Advised results negative.   

## 2019-05-03 ENCOUNTER — Emergency Department (HOSPITAL_COMMUNITY)

## 2019-05-03 ENCOUNTER — Other Ambulatory Visit: Payer: Self-pay

## 2019-05-03 ENCOUNTER — Emergency Department (HOSPITAL_COMMUNITY)
Admission: EM | Admit: 2019-05-03 | Discharge: 2019-05-03 | Attending: Emergency Medicine | Admitting: Emergency Medicine

## 2019-05-03 DIAGNOSIS — R4182 Altered mental status, unspecified: Secondary | ICD-10-CM | POA: Diagnosis not present

## 2019-05-03 DIAGNOSIS — Z79899 Other long term (current) drug therapy: Secondary | ICD-10-CM | POA: Insufficient documentation

## 2019-05-03 DIAGNOSIS — F209 Schizophrenia, unspecified: Secondary | ICD-10-CM | POA: Diagnosis not present

## 2019-05-03 LAB — TROPONIN I (HIGH SENSITIVITY)
Troponin I (High Sensitivity): 10 ng/L (ref <18)
Troponin I (High Sensitivity): 24 ng/L — ABNORMAL HIGH (ref <18)
Troponin I (High Sensitivity): 25 ng/L — ABNORMAL HIGH (ref <18)

## 2019-05-03 LAB — COMPREHENSIVE METABOLIC PANEL
ALT: 18 U/L (ref 0–44)
AST: 26 U/L (ref 15–41)
Albumin: 5 g/dL (ref 3.5–5.0)
Alkaline Phosphatase: 49 U/L (ref 38–126)
Anion gap: 13 (ref 5–15)
BUN: 24 mg/dL — ABNORMAL HIGH (ref 6–20)
CO2: 23 mmol/L (ref 22–32)
Calcium: 9.4 mg/dL (ref 8.9–10.3)
Chloride: 104 mmol/L (ref 98–111)
Creatinine, Ser: 1.22 mg/dL (ref 0.61–1.24)
GFR calc Af Amer: >60 mL/min (ref >60)
GFR calc non Af Amer: >60 mL/min (ref >60)
Glucose, Bld: 104 mg/dL — ABNORMAL HIGH (ref 70–99)
Potassium: 3.8 mmol/L (ref 3.5–5.1)
Sodium: 140 mmol/L (ref 135–145)
Total Bilirubin: 1 mg/dL (ref 0.3–1.2)
Total Protein: 8.3 g/dL — ABNORMAL HIGH (ref 6.5–8.1)

## 2019-05-03 LAB — I-STAT CHEM 8, ED
BUN: 24 mg/dL — ABNORMAL HIGH (ref 6–20)
Calcium, Ion: 1.09 mmol/L — ABNORMAL LOW (ref 1.15–1.40)
Chloride: 105 mmol/L (ref 98–111)
Creatinine, Ser: 1 mg/dL (ref 0.61–1.24)
Glucose, Bld: 105 mg/dL — ABNORMAL HIGH (ref 70–99)
HCT: 51 % (ref 39.0–52.0)
Hemoglobin: 17.3 g/dL — ABNORMAL HIGH (ref 13.0–17.0)
Potassium: 3.8 mmol/L (ref 3.5–5.1)
Sodium: 139 mmol/L (ref 135–145)
TCO2: 20 mmol/L — ABNORMAL LOW (ref 22–32)

## 2019-05-03 LAB — CBC
HCT: 50.3 % (ref 39.0–52.0)
Hemoglobin: 16.9 g/dL (ref 13.0–17.0)
MCH: 31.2 pg (ref 26.0–34.0)
MCHC: 33.6 g/dL (ref 30.0–36.0)
MCV: 93 fL (ref 80.0–100.0)
Platelets: 272 10*3/uL (ref 150–400)
RBC: 5.41 MIL/uL (ref 4.22–5.81)
RDW: 12 % (ref 11.5–15.5)
WBC: 5.9 10*3/uL (ref 4.0–10.5)
nRBC: 0 % (ref 0.0–0.2)

## 2019-05-03 LAB — DIFFERENTIAL
Abs Immature Granulocytes: 0.01 10*3/uL (ref 0.00–0.07)
Basophils Absolute: 0 10*3/uL (ref 0.0–0.1)
Basophils Relative: 0 %
Eosinophils Absolute: 0 10*3/uL (ref 0.0–0.5)
Eosinophils Relative: 0 %
Immature Granulocytes: 0 %
Lymphocytes Relative: 23 %
Lymphs Abs: 1.4 10*3/uL (ref 0.7–4.0)
Monocytes Absolute: 0.3 10*3/uL (ref 0.1–1.0)
Monocytes Relative: 5 %
Neutro Abs: 4.2 10*3/uL (ref 1.7–7.7)
Neutrophils Relative %: 72 %

## 2019-05-03 LAB — ETHANOL: Alcohol, Ethyl (B): <10 mg/dL (ref <10)

## 2019-05-03 LAB — PROTIME-INR
INR: 1.1 (ref 0.8–1.2)
Prothrombin Time: 13.7 seconds (ref 11.4–15.2)

## 2019-05-03 LAB — APTT: aPTT: 31 seconds (ref 24–36)

## 2019-05-03 LAB — ACETAMINOPHEN LEVEL: Acetaminophen (Tylenol), Serum: <10 ug/mL — ABNORMAL LOW (ref 10–30)

## 2019-05-03 LAB — SALICYLATE LEVEL: Salicylate Lvl: <7 mg/dL — ABNORMAL LOW (ref 7.0–30.0)

## 2019-05-03 MED ORDER — IOHEXOL 350 MG/ML SOLN
80.0000 mL | Freq: Once | INTRAVENOUS | Status: AC | PRN
  Administered 2019-05-03: 80 mL via INTRAVENOUS

## 2019-05-03 MED ORDER — SODIUM CHLORIDE 0.9 % IV BOLUS
1000.0000 mL | Freq: Once | INTRAVENOUS | Status: AC
  Administered 2019-05-03: 1000 mL via INTRAVENOUS

## 2019-05-03 MED ORDER — LORAZEPAM 2 MG/ML IJ SOLN
2.0000 mg | Freq: Once | INTRAMUSCULAR | Status: AC
  Administered 2019-05-03: 2 mg via INTRAVENOUS
  Filled 2019-05-03: qty 1

## 2019-05-03 MED ORDER — SODIUM CHLORIDE (PF) 0.9 % IJ SOLN
INTRAMUSCULAR | Status: AC
  Filled 2019-05-03: qty 50

## 2019-05-03 NOTE — ED Notes (Signed)
MRI called stating patient is refusing to proceed with scan. Officers at scan explaining patients rights at this time.
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
## 2019-05-03 NOTE — Progress Notes (Signed)
  Patient was refusing scan "because of consitutional rights, per [facility] jail officer, patient is in their custody and unable to refuse study, Per PA Claudette Stapler scan had to be done because patient unable to move his right side of his body and had slurred speech, and medically necessary.   Patient come in with altered mental status, Patient started yelling during the scan to stop and he moved his right leg"

## 2019-05-03 NOTE — ED Notes (Signed)
PT denied being able to void at this time, will monitor. 

## 2019-05-03 NOTE — Discharge Instructions (Addendum)
As discussed, all of your labs were reassuring today. You images were unremarkable, but we were unable to get an MRI of your brain because you declined it. Return to the ER for new or worsening symptoms.

## 2019-05-03 NOTE — ED Notes (Signed)
Patient ambulatory out of the department with no difficulty  

## 2019-05-03 NOTE — ED Triage Notes (Addendum)
Pt to ED with c/o of altered mental status. Pt was in jail and [facility] was dispatched for OD. Pt has been in jail for 2 days, pt today was yelling and beating door, warm and sweaty. Pt was not able to answer questions or follow commands for [employer] in jail. [facility] states patient has calmed down but has slurred speech and didn't respond appropriately.  When moved to chair patient was bale to pick up feet and hold 3 feet off floor. Pt previously to being in jail, fasting for religious beliefs. Pt was also given 4 of Narcan at the jail.

## 2019-05-03 NOTE — ED Provider Notes (Signed)
[facility]-EMERGENCY DEPT Provider Note   CSN: [id number] Arrival date & time: 05/03/19  1438     History Chief Complaint  Patient presents with  . Altered Mental Status    Lance Rich is a 19 y.o. male with a past medical history significant for schizophrenia, adjustment disorder, intermittent explosive disorder, and cannabis use who presents to the ED via EMS from jail due to altered mental status. Patient will not communicate during my initial evaluation. Eyes are opened and he is able to follow commands. Per triage note, roughly 1 hour ago, patient became agitated and began yelling and beating the door. During this time, patient was warm with diaphoresis. Suspected OD, so patient was given 4mg  Narcan at jail. EMS noted patient to have slurred speech.   Last known normal 1:45PM  Level 5 caveat secondary to altered mental status.    No past medical history on file.  Patient Active Problem List   Diagnosis Date Noted  . Intermittent explosive disorder in adult 06/04/2018  . Schizophrenia (HCC) 06/04/2018  . 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 Intermittent explosive disorder 03/16/2016  . Cannabis intoxication with perceptual disturbance (HCC) 03/16/2016    No past surgical history on file.     Family History  Problem Relation Age of Onset  . Healthy Mother   . Healthy Father     Social History   Tobacco Use  . Smoking status: Never Smoker  . Smokeless tobacco: Never Used  Substance Use Topics  . Alcohol use: No  . Drug use: Not Currently    Types: Marijuana    Home Medications Prior to Admission medications   Medication Sig Start Date End Date Taking? Authorizing Provider  ARIPiprazole ER (ABILIFY MAINTENA) 400 MG SRER injection Inject 2 mLs (400 mg total) into the muscle every 28 (twenty-eight) days. Next due 07/11/2018 07/08/18  Yes 07/10/18, MD  benztropine (COGENTIN) 1 MG tablet Take 1  tablet (1 mg total) by mouth 2 (two) times daily. 06/12/18  Yes 08/12/18, MD  metoprolol succinate (TOPROL-XL) 100 MG 24 hr tablet Take 1 tablet (100 mg total) by mouth daily. Take with or immediately following a meal. 06/12/18  Yes 08/12/18, MD  olopatadine (PATADAY) 0.1 % ophthalmic solution Place 1 drop into both eyes 2 (two) times daily. 01/09/19  Yes Darr, 03/09/19, PA-C  ondansetron (ZOFRAN) 4 MG tablet Take 1 tablet (4 mg total) by mouth every 8 (eight) hours as needed for nausea or vomiting. 01/09/19  Yes Darr, 03/09/19, PA-C  risperiDONE (RISPERDAL) 3 MG tablet Take 2 tablets (6 mg total) by mouth at bedtime. 06/12/18  Yes 08/12/18, MD  temazepam (RESTORIL) 30 MG capsule Take 1 capsule (30 mg total) by mouth at bedtime. 06/12/18  Yes 08/12/18, MD    Allergies    Patient has no known allergies.  Review of Systems   Review of Systems  Unable to perform ROS: Mental status change    Physical Exam Updated Vital Signs BP 140/84   Pulse 93   Temp 99.4 F (37.4 C) (Oral)   Resp (!) 21   Ht 5\' 11"  (1.803 m)   Wt 59 kg   SpO2 99%   BMI 18.13 kg/m   Physical Exam Vitals and nursing note reviewed.  Constitutional:      General: He is not in acute distress.    Appearance: He is not toxic-appearing.  HENT:  Head: Normocephalic.     Comments: No signs of trauma on skull.    Nose: Nose normal.  Eyes:     Extraocular Movements: Extraocular movements intact.     Pupils: Pupils are equal, round, and reactive to light.  Cardiovascular:     Rate and Rhythm: Normal rate and regular rhythm.     Pulses: Normal pulses.     Heart sounds: Normal heart sounds. No murmur. No friction rub. No gallop.   Pulmonary:     Effort: Pulmonary effort is normal.     Breath sounds: Normal breath sounds.  Abdominal:     General: Abdomen is flat. Bowel sounds are normal. There is no distension.     Palpations: Abdomen is soft.     Tenderness: There is no abdominal tenderness. There is no  guarding or rebound.  Musculoskeletal:     Cervical back: Neck supple.     Comments: No lower extremity edema  Skin:    Comments: No signs of trauma throughout body  Neurological:     General: No focal deficit present.     Mental Status: He is alert.     Comments: Will not communicate or answer questions. Able to follow short commands. Good reflexes throughout. Weaker right grip strength compared to left.  Psychiatric:        Speech: He is noncommunicative.     ED Results / Procedures / Treatments   Labs (all labs ordered are listed, but only abnormal results are displayed) Labs Reviewed  COMPREHENSIVE METABOLIC PANEL - Abnormal; Notable for the following components:      Result Value   Glucose, Bld 104 (*)    BUN 24 (*)    Total Protein 8.3 (*)    All other components within normal limits  ACETAMINOPHEN LEVEL - Abnormal; Notable for the following components:   Acetaminophen (Tylenol), Serum <10 (*)    All other components within normal limits  SALICYLATE LEVEL - Abnormal; Notable for the following components:   Salicylate Lvl <5.1 (*)    All other components within normal limits  I-STAT CHEM 8, ED - Abnormal; Notable for the following components:   BUN 24 (*)    Glucose, Bld 105 (*)    Calcium, Ion 1.09 (*)    TCO2 20 (*)    Hemoglobin 17.3 (*)    All other components within normal limits  TROPONIN I (HIGH SENSITIVITY) - Abnormal; Notable for the following components:   Troponin I (High Sensitivity) 24 (*)    All other components within normal limits  TROPONIN I (HIGH SENSITIVITY) - Abnormal; Notable for the following components:   Troponin I (High Sensitivity) 25 (*)    All other components within normal limits  PROTIME-INR  APTT  CBC  DIFFERENTIAL  ETHANOL  RAPID URINE DRUG SCREEN, HOSP PERFORMED  URINALYSIS, ROUTINE W REFLEX MICROSCOPIC  TROPONIN I (HIGH SENSITIVITY)    EKG EKG Interpretation  Date/Time:  Sunday May 03 2019 14:50:32 EDT Ventricular  Rate:  96 PR Interval:    QRS Duration: 80 QT Interval:  330 QTC Calculation: 417 R Axis:   44 Text Interpretation: Sinus rhythm Right atrial enlargement No significant change since last tracing Confirmed by Wandra Arthurs [phone]) on 05/03/2019 3:54:46 PM   Radiology CT Angio Head W or Wo Contrast  Result Date: 05/03/2019 CLINICAL DATA:  Altered mental status.  Overdose.  Slurred speech. EXAM: CT ANGIOGRAPHY HEAD AND NECK TECHNIQUE: Multidetector CT imaging of the head and neck was performed  using the standard protocol during bolus administration of intravenous contrast. Multiplanar CT image reconstructions and MIPs were obtained to evaluate the vascular anatomy. Carotid stenosis measurements (when applicable) are obtained utilizing NASCET criteria, using the distal internal carotid diameter as the denominator. CONTRAST:  53mL OMNIPAQUE IOHEXOL 350 MG/ML SOLN COMPARISON:  Head CT same day FINDINGS: The study suffers from some motion degradation but is sufficiently diagnostic. CTA NECK FINDINGS Aortic arch: Normal Right carotid system: Normal Left carotid system: Normal Vertebral arteries: Normal Skeleton: Normal Other neck: No mass, adenopathy, traumatic or inflammatory change. Upper chest: Normal Review of the MIP images confirms the above findings CTA HEAD FINDINGS Anterior circulation: Normal Posterior circulation:  Normal Venous sinuses: Normal Anatomic variants: None Review of the MIP images confirms the above findings IMPRESSION: Normal CT angiography of the head and neck. Some motion degradation. No evidence of atherosclerotic disease, dissection or large/medium vessel occlusion. Electronically Signed   By: Paulina Fusi M.D.   On: 05/03/2019 17:58   CT HEAD WO CONTRAST  Result Date: 05/03/2019 CLINICAL DATA:  Altered level of consciousness, slurred speech EXAM: CT HEAD WITHOUT CONTRAST TECHNIQUE: Contiguous axial images were obtained from the base of the skull through the vertex without  intravenous contrast. COMPARISON:  None. FINDINGS: Brain: No acute infarct or hemorrhage. Lateral ventricles and midline structures are unremarkable. No acute extra-axial fluid collections. No mass effect. Vascular: No hyperdense vessel or unexpected calcification. Skull: Normal. Negative for fracture or focal lesion. Sinuses/Orbits: No acute finding. Other: None. IMPRESSION: Normal CT head without contrast. Electronically Signed   By: Sharlet Salina M.D.   On: 05/03/2019 15:47   CT Angio Neck W and/or Wo Contrast  Result Date: 05/03/2019 CLINICAL DATA:  Altered mental status.  Overdose.  Slurred speech. EXAM: CT ANGIOGRAPHY HEAD AND NECK TECHNIQUE: Multidetector CT imaging of the head and neck was performed using the standard protocol during bolus administration of intravenous contrast. Multiplanar CT image reconstructions and MIPs were obtained to evaluate the vascular anatomy. Carotid stenosis measurements (when applicable) are obtained utilizing NASCET criteria, using the distal internal carotid diameter as the denominator. CONTRAST:  38mL OMNIPAQUE IOHEXOL 350 MG/ML SOLN COMPARISON:  Head CT same day FINDINGS: The study suffers from some motion degradation but is sufficiently diagnostic. CTA NECK FINDINGS Aortic arch: Normal Right carotid system: Normal Left carotid system: Normal Vertebral arteries: Normal Skeleton: Normal Other neck: No mass, adenopathy, traumatic or inflammatory change. Upper chest: Normal Review of the MIP images confirms the above findings CTA HEAD FINDINGS Anterior circulation: Normal Posterior circulation:  Normal Venous sinuses: Normal Anatomic variants: None Review of the MIP images confirms the above findings IMPRESSION: Normal CT angiography of the head and neck. Some motion degradation. No evidence of atherosclerotic disease, dissection or large/medium vessel occlusion. Electronically Signed   By: Paulina Fusi M.D.   On: 05/03/2019 17:58   DG Chest Portable 1 View  Result  Date: 05/03/2019 CLINICAL DATA:  19 year old male with chest pain. EXAM: PORTABLE CHEST 1 VIEW COMPARISON:  None. FINDINGS: The lungs are clear. There is no pleural effusion pneumothorax. The cardiac silhouette is within limits. No acute osseous pathology. IMPRESSION: No active disease. Electronically Signed   By: Elgie Collard M.D.   On: 05/03/2019 16:03    Procedures Procedures (including critical care time)  Medications Ordered in ED Medications  sodium chloride (PF) 0.9 % injection (has no administration in time range)  sodium chloride 0.9 % bolus 1,000 mL (0 mLs Intravenous Stopped 05/03/19 1952)  iohexol (OMNIPAQUE)  350 MG/ML injection 80 mL (80 mLs Intravenous Contrast Given 05/03/19 1733)  LORazepam (ATIVAN) injection 2 mg (2 mg Intravenous Given 05/03/19 1946)    ED Course  I have reviewed the triage vital signs and the nursing notes.  Pertinent labs & imaging results that were available during my care of the patient were reviewed by me and considered in my medical decision making (see chart for details).  Clinical Course as of May 02 2140  Sun May 03, 2019  1544 Informed by RN that she found out that patient was banging on the door complaining of chest pain. Will place order for troponin and CXR   [CA]  1630 Troponin I (High Sensitivity): 10 [CA]  1716 Reassessed patient at bedside. Patient still not communicative. Still showing signs of right sided weakness. Will obtain MRI head and CTA head/neck to rule out stroke.   [CA]  1928 Troponin I (High Sensitivity)(!): 24 [CA]  2138 Patient deferred MRI of brain. Upon reassessment, patient moving bilateral upper extremities without any difficulty.    [CA]  2138 Troponin I (High Sensitivity)(!): 25 [CA]    Clinical Course User Index [CA] Mannie Stabile, PA-C   MDM Rules/Calculators/A&P                     19 year old male presents to the ED via EMS from jail due to altered mental status.  Upon arrival, patient is  noncommunicative, but awake and follows short commands.  Stable vitals.  Patient in no acute distress and nontoxic-appearing.  Good reflexes bilaterally.  Weaker grip strength on the right compared to left.  No signs of trauma.  Will obtain routine labs and CT head to rule out intracranial hemorrhage.  Informed by RN that the jail notes that patient was complaining of chest pain before he became agitated. Troponin added to work-up to rule out ACS.  CT head personally reviewed which is negative for any acute abnormalities.  Chest x-ray personally reviewed which is negative for signs of pneumonia, pneumothorax, or widened mediastinum. EKG personally reviewed which demonstrates normal sinus rhythm with no signs of acute ischemia. CBC unremarkable without a leukocytosis. CMP reassuring with no major electrolyte derangement. Initial troponin normal at 10. Will obtain delta troponin to rule out ACS.   CTA head/neck personally reviewed which demonstrates: IMPRESSION:  Normal CT angiography of the head and neck. Some motion degradation.  No evidence of atherosclerotic disease, dissection or large/medium  vessel occlusion.   Discussed case with Dr. Wilford Corner neurology who is agreeable with plan of action in regards to MRI of brain.   Patient deferred MRI of brain. Upon reassessment, patient moving all 4 extremities without difficulty. Equal grip strength. Unable to collect urine. 2nd and 3rd troponin flat. No concern for ACS at this time. Unknown etiology of AMS during initial evaluation, but patient appears back to baseline communicating and moving all extremities without difficulty. Strict ED precautions discussed with patient. Patient states understanding and agrees to plan. Patient discharged back to jail in no acute distress and stable vitals  Discussed case with Dr. Silverio Lay who evaluated patient at bedside and agrees with assessment and plan.  Final Clinical Impression(s) / ED Diagnoses Final diagnoses:   Altered mental status, unspecified altered mental status type    Rx / DC Orders ED Discharge Orders    None       Jesusita Oka 05/03/19 2143    Charlynne Pander, MD 05/04/19 [phone]
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
## 2019-05-03 NOTE — ED Notes (Signed)
Police at bedside ..

## 2019-05-03 NOTE — ED Notes (Addendum)
Patient attempted to provide urine sample, unable to at this time. Patient stating he does not need a scan, able to move his right hand but is not able to lift his arm. Speech is still slurred.

## 2021-03-14 IMAGING — CT CT ANGIO HEAD
2 of 7 series · 8 of 33 positions shown · IV contrast (omnipaque)
Comparison: Head CT same day

CLINICAL DATA: Altered mental status.  Overdose.  Slurred speech.



[Series 5: cta head neck · axial · 0.59mm/px · z∈[-247,-123]mm · 2 of 186 slices shown]
[im 62/186  soft-tissue]
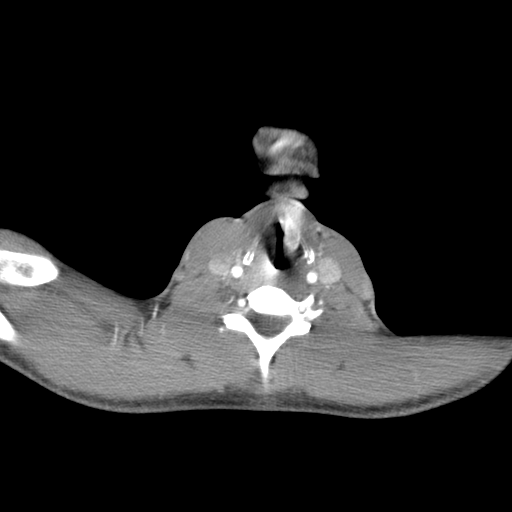
[im 124/186  soft-tissue]
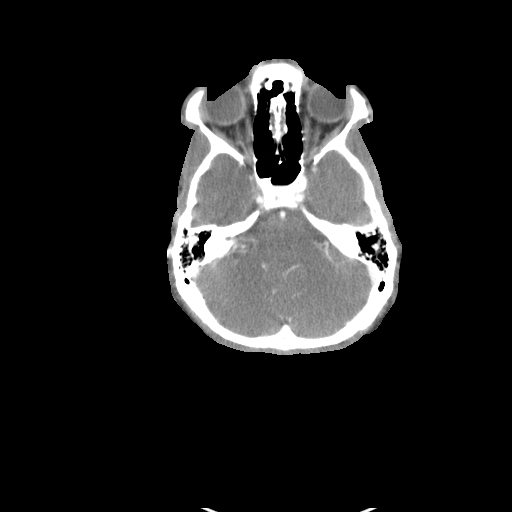

[Series 7: ax thin · axial · 0.66mm/px · z∈[-317,-58]mm · 6 of 363 slices shown]
[im 52/363  soft-tissue]
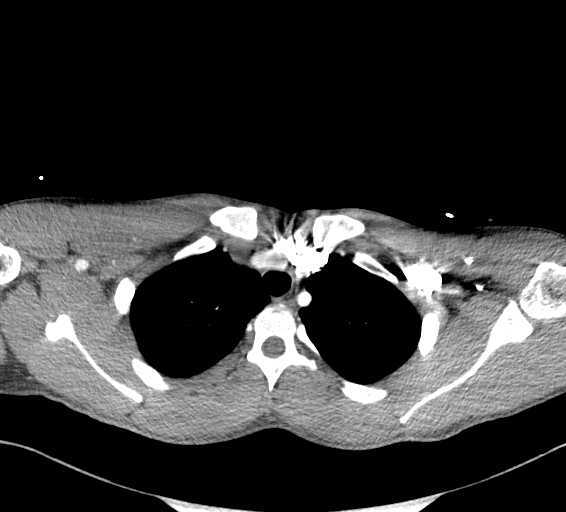
[im 104/363  bone]
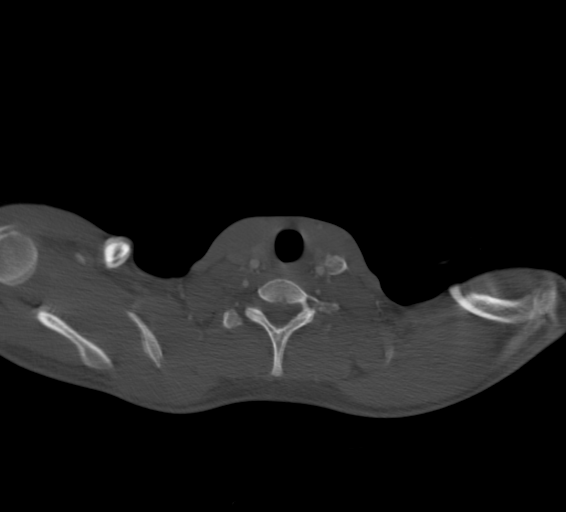
[im 156/363  soft-tissue]
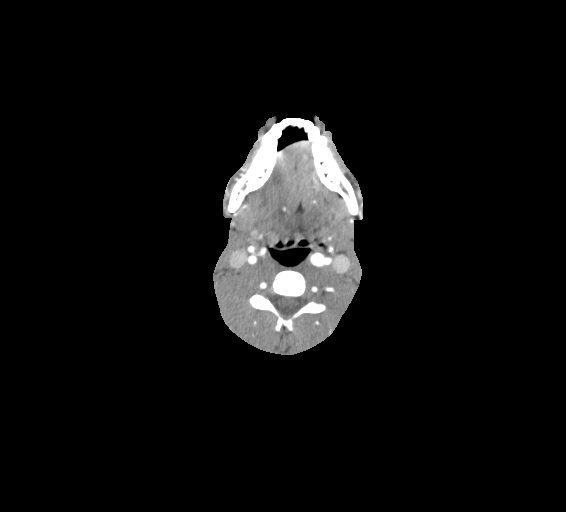
[im 207/363  bone]
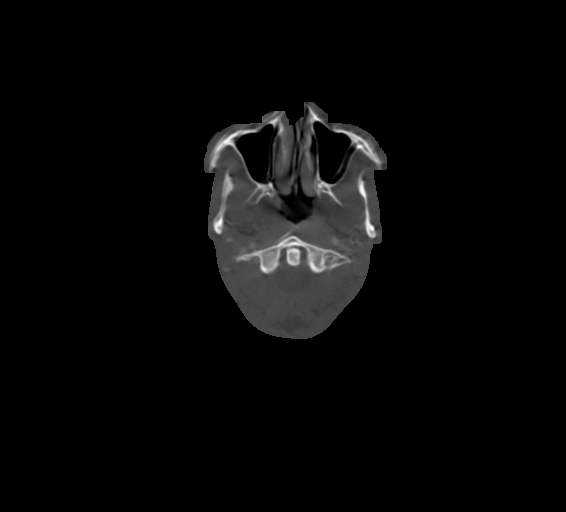
[im 259/363  soft-tissue]
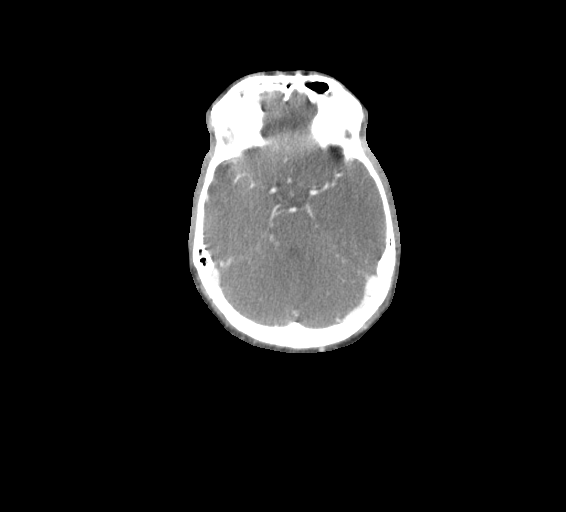
[im 311/363  bone]
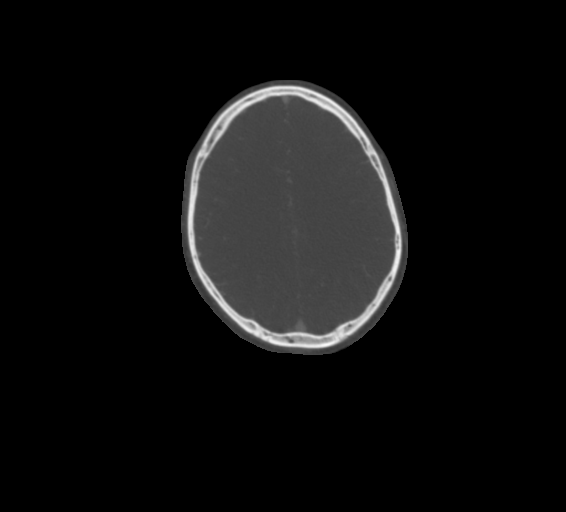

[8 of 33 positions shown; findings below may reference images not displayed]

FINDINGS: The study suffers from some motion degradation but is sufficiently
diagnostic.

CTA NECK FINDINGS

Aortic arch: Normal

Right carotid system: Normal

Left carotid system: Normal

Vertebral arteries: Normal

Skeleton: Normal

Other neck: No mass, adenopathy, traumatic or inflammatory change.

Upper chest: Normal

Review of the MIP images confirms the above findings

CTA HEAD FINDINGS

Anterior circulation: Normal

Posterior circulation:  Normal

Venous sinuses: Normal

Anatomic variants: None

Review of the MIP images confirms the above findings
IMPRESSION: Normal CT angiography of the head and neck. Some motion degradation.
No evidence of atherosclerotic disease, dissection or large/medium
vessel occlusion.

## 2021-11-10 ENCOUNTER — Emergency Department (HOSPITAL_COMMUNITY)
Admission: EM | Admit: 2021-11-10 | Discharge: 2021-11-12 | Disposition: A | Discharge status: Home / Self Care | Attending: Emergency Medicine | Admitting: Emergency Medicine

## 2021-11-10 ENCOUNTER — Other Ambulatory Visit: Payer: Self-pay

## 2021-11-10 DIAGNOSIS — R4689 Other symptoms and signs involving appearance and behavior: Secondary | ICD-10-CM

## 2021-11-10 DIAGNOSIS — R45850 Homicidal ideations: Secondary | ICD-10-CM

## 2021-11-10 DIAGNOSIS — F209 Schizophrenia, unspecified: Secondary | ICD-10-CM

## 2021-11-10 DIAGNOSIS — F1924 Other psychoactive substance dependence with psychoactive substance-induced mood disorder: Secondary | ICD-10-CM | POA: Insufficient documentation

## 2021-11-10 DIAGNOSIS — F1994 Other psychoactive substance use, unspecified with psychoactive substance-induced mood disorder: Secondary | ICD-10-CM

## 2021-11-10 DIAGNOSIS — Z20822 Contact with and (suspected) exposure to covid-19: Secondary | ICD-10-CM | POA: Insufficient documentation

## 2021-11-10 LAB — CBC WITH DIFFERENTIAL/PLATELET
Abs Immature Granulocytes: 0.02 10*3/uL (ref 0.00–0.07)
Basophils Absolute: 0.1 10*3/uL (ref 0.0–0.1)
Basophils Relative: 1 %
Eosinophils Absolute: 0.1 10*3/uL (ref 0.0–0.5)
Eosinophils Relative: 1 %
HCT: 48.5 % (ref 39.0–52.0)
Hemoglobin: 16.1 g/dL (ref 13.0–17.0)
Immature Granulocytes: 0 %
Lymphocytes Relative: 48 %
Lymphs Abs: 4.7 10*3/uL — ABNORMAL HIGH (ref 0.7–4.0)
MCH: 31.4 pg (ref 26.0–34.0)
MCHC: 33.2 g/dL (ref 30.0–36.0)
MCV: 94.7 fL (ref 80.0–100.0)
Monocytes Absolute: 0.5 10*3/uL (ref 0.1–1.0)
Monocytes Relative: 6 %
Neutro Abs: 4.2 10*3/uL (ref 1.7–7.7)
Neutrophils Relative %: 44 %
Platelets: 331 10*3/uL (ref 150–400)
RBC: 5.12 MIL/uL (ref 4.22–5.81)
RDW: 12.5 % (ref 11.5–15.5)
WBC: 9.6 10*3/uL (ref 4.0–10.5)
nRBC: 0 % (ref 0.0–0.2)

## 2021-11-10 LAB — COMPREHENSIVE METABOLIC PANEL
ALT: 15 U/L (ref 0–44)
AST: 27 U/L (ref 15–41)
Albumin: 5.1 g/dL — ABNORMAL HIGH (ref 3.5–5.0)
Alkaline Phosphatase: 52 U/L (ref 38–126)
Anion gap: 19 — ABNORMAL HIGH (ref 5–15)
BUN: 11 mg/dL (ref 6–20)
CO2: 24 mmol/L (ref 22–32)
Calcium: 10.6 mg/dL — ABNORMAL HIGH (ref 8.9–10.3)
Chloride: 105 mmol/L (ref 98–111)
Creatinine, Ser: 1.11 mg/dL (ref 0.61–1.24)
GFR, Estimated: >60 mL/min (ref >60)
Glucose, Bld: 127 mg/dL — ABNORMAL HIGH (ref 70–99)
Potassium: 3.4 mmol/L — ABNORMAL LOW (ref 3.5–5.1)
Sodium: 148 mmol/L — ABNORMAL HIGH (ref 135–145)
Total Bilirubin: 0.7 mg/dL (ref 0.3–1.2)
Total Protein: 8.3 g/dL — ABNORMAL HIGH (ref 6.5–8.1)

## 2021-11-10 LAB — RAPID URINE DRUG SCREEN, HOSP PERFORMED
Amphetamines: NOT DETECTED
Barbiturates: NOT DETECTED
Benzodiazepines: NOT DETECTED
Cocaine: NOT DETECTED
Opiates: NOT DETECTED
Tetrahydrocannabinol: POSITIVE — AB

## 2021-11-10 LAB — ETHANOL: Alcohol, Ethyl (B): <10 mg/dL (ref <10)

## 2021-11-10 LAB — ACETAMINOPHEN LEVEL: Acetaminophen (Tylenol), Serum: <10 ug/mL — ABNORMAL LOW (ref 10–30)

## 2021-11-10 LAB — SALICYLATE LEVEL: Salicylate Lvl: <7 mg/dL — ABNORMAL LOW (ref 7.0–30.0)

## 2021-11-10 MED ORDER — LORAZEPAM 2 MG/ML IJ SOLN
2.0000 mg | Freq: Once | INTRAMUSCULAR | Status: AC
  Administered 2021-11-10: 2 mg via INTRAMUSCULAR
  Filled 2021-11-10: qty 1

## 2021-11-10 MED ORDER — HALOPERIDOL LACTATE 5 MG/ML IJ SOLN
5.0000 mg | Freq: Once | INTRAMUSCULAR | Status: AC
  Administered 2021-11-10: 5 mg via INTRAMUSCULAR
  Filled 2021-11-10: qty 1

## 2021-11-10 MED ORDER — DIPHENHYDRAMINE HCL 50 MG/ML IJ SOLN
50.0000 mg | Freq: Once | INTRAMUSCULAR | Status: AC
  Administered 2021-11-10: 50 mg via INTRAMUSCULAR
  Filled 2021-11-10: qty 1

## 2021-11-10 NOTE — ED Notes (Addendum)
Trial out of restraints. Resting on side. Blanket given. Cooperative with this RN.

## 2021-11-10 NOTE — ED Notes (Signed)
Refusing COVID swab at this time. Informed it will have to be obtained eventually.

## 2021-11-10 NOTE — ED Notes (Signed)
Pt refusing vitals.

## 2021-11-10 NOTE — ED Notes (Signed)
IVC paperwork done and in the blue nurses station on a purple clipboard; IVC'ed on: 11/10/21 Exp: 11/17/21
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
## 2021-11-10 NOTE — ED Provider Notes (Signed)
[facility] EMERGENCY DEPARTMENT Provider Note   CSN: [id number] Arrival date & time: 11/10/21  2118     History {Add pertinent medical, surgical, social history, OB history to HPI:1} Chief Complaint  Patient presents with   Psychiatric Evaluation    Blanchard Willhite is a 21 y.o. male.  HPI     Home Medications Prior to Admission medications   Medication Sig Start Date End Date Taking? Authorizing Provider  ARIPiprazole ER (ABILIFY MAINTENA) 400 MG SRER injection Inject 2 mLs (400 mg total) into the muscle every 28 (twenty-eight) days. Next due 07/11/2018 07/08/18   Johnn Hai, MD  benztropine (COGENTIN) 1 MG tablet Take 1 tablet (1 mg total) by mouth 2 (two) times daily. 06/12/18   Johnn Hai, MD  metoprolol succinate (TOPROL-XL) 100 MG 24 hr tablet Take 1 tablet (100 mg total) by mouth daily. Take with or immediately following a meal. 06/12/18   Johnn Hai, MD  olopatadine (PATADAY) 0.1 % ophthalmic solution Place 1 drop into both eyes 2 (two) times daily. 01/09/19   Darr, Edison Nasuti, PA-C  ondansetron (ZOFRAN) 4 MG tablet Take 1 tablet (4 mg total) by mouth every 8 (eight) hours as needed for nausea or vomiting. 01/09/19   Darr, Edison Nasuti, PA-C  risperiDONE (RISPERDAL) 3 MG tablet Take 2 tablets (6 mg total) by mouth at bedtime. 06/12/18   Johnn Hai, MD  temazepam (RESTORIL) 30 MG capsule Take 1 capsule (30 mg total) by mouth at bedtime. 06/12/18   Johnn Hai, MD      Allergies    Patient has no known allergies.    Review of Systems   Review of Systems  Physical Exam Updated Vital Signs There were no vitals taken for this visit. Physical Exam  ED Results / Procedures / Treatments   Labs (all labs ordered are listed, but only abnormal results are displayed) Labs Reviewed  COMPREHENSIVE METABOLIC PANEL - Abnormal; Notable for the following components:      Result Value   Sodium 148 (*)    Potassium 3.4 (*)    Glucose, Bld 127 (*)    Calcium 10.6 (*)    Total  Protein 8.3 (*)    Albumin 5.1 (*)    Anion gap 19 (*)    All other components within normal limits  RAPID URINE DRUG SCREEN, HOSP PERFORMED - Abnormal; Notable for the following components:   Tetrahydrocannabinol POSITIVE (*)    All other components within normal limits  CBC WITH DIFFERENTIAL/PLATELET - Abnormal; Notable for the following components:   Lymphs Abs 4.7 (*)    All other components within normal limits  ACETAMINOPHEN LEVEL - Abnormal; Notable for the following components:   Acetaminophen (Tylenol), Serum <10 (*)    All other components within normal limits  SALICYLATE LEVEL - Abnormal; Notable for the following components:   Salicylate Lvl <1.6 (*)    All other components within normal limits  RESP PANEL BY RT-PCR (FLU A&B, COVID) ARPGX2  ETHANOL    EKG None  Radiology No results found.  Procedures Procedures  {Document cardiac monitor, telemetry assessment procedure when appropriate:1}  Medications Ordered in ED Medications  LORazepam (ATIVAN) injection 2 mg (2 mg Intramuscular Given 11/10/21 2218)  diphenhydrAMINE (BENADRYL) injection 50 mg (50 mg Intramuscular Given 11/10/21 2219)  haloperidol lactate (HALDOL) injection 5 mg (5 mg Intramuscular Given 11/10/21 2219)    ED Course/ Medical Decision Making/ A&P  Medical Decision Making  ***  {Document critical care time when appropriate:1} {Document review of labs and clinical decision tools ie heart score, Chads2Vasc2 etc:1}  {Document your independent review of radiology images, and any outside records:1} {Document your discussion with family members, caretakers, and with consultants:1} {Document social determinants of health affecting pt's care:1} {Document your decision making why or why not admission, treatments were needed:1} Final Clinical Impression(s) / ED Diagnoses Final diagnoses:  None    Rx / DC Orders ED Discharge Orders     None
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
## 2021-11-11 DIAGNOSIS — F209 Schizophrenia, unspecified: Secondary | ICD-10-CM

## 2021-11-11 DIAGNOSIS — R45850 Homicidal ideations: Secondary | ICD-10-CM

## 2021-11-11 DIAGNOSIS — R4689 Other symptoms and signs involving appearance and behavior: Secondary | ICD-10-CM

## 2021-11-11 DIAGNOSIS — F1994 Other psychoactive substance use, unspecified with psychoactive substance-induced mood disorder: Secondary | ICD-10-CM

## 2021-11-11 LAB — RESP PANEL BY RT-PCR (FLU A&B, COVID) ARPGX2
Influenza A by PCR: NEGATIVE
Influenza B by PCR: NEGATIVE
SARS Coronavirus 2 by RT PCR: NEGATIVE

## 2021-11-11 LAB — TSH: TSH: 3.541 u[IU]/mL (ref 0.350–4.500)

## 2021-11-11 MED ORDER — CLONIDINE HCL 0.1 MG PO TABS: 0.1000 mg | ORAL_TABLET | Freq: Three times a day (TID) | ORAL | Status: DC | PRN

## 2021-11-11 MED ORDER — LORAZEPAM 2 MG/ML IJ SOLN: 1.0000 mg | Freq: Two times a day (BID) | INTRAMUSCULAR | Status: DC | PRN

## 2021-11-11 MED ORDER — HYDROXYZINE HCL 25 MG PO TABS: 25.0000 mg | ORAL_TABLET | Freq: Three times a day (TID) | ORAL | Status: DC | PRN

## 2021-11-11 MED ORDER — TRAZODONE HCL 50 MG PO TABS
50.0000 mg | ORAL_TABLET | Freq: Every day | ORAL | Status: DC
  Filled 2021-11-11: qty 1

## 2021-11-11 NOTE — ED Notes (Signed)
When this RN arrived to shift, pt still had all belongings and was not dressed out in purple scrubs. NT dressed pt out into purple scrubs and wanded by security. Pt's belongings now at nurses station. Pt was calm and cooperative during this time.

## 2021-11-11 NOTE — ED Notes (Signed)
Pt updated on plan of care. Refused COVID and labs. Pt calm and cooperative.
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
## 2021-11-11 NOTE — ED Notes (Signed)
Pt resting in bed with eyes closed, no s/s of distress, sitter present in the room for patient safety, will continue to monitor.

## 2021-11-11 NOTE — ED Notes (Signed)
Pt woke to attempt labs. Refusing labs at this time. Continues to refuse swab.

## 2021-11-12 NOTE — ED Provider Notes (Signed)
Patient has been cleared by psychiatry.  Patient denies any homicidal or suicidal ideations.  He has good eye contact and he is cooperative.  IVC rescinded and patient given outpatient resources.   Blanchie Dessert, MD 11/12/21 1047

## 2021-11-12 NOTE — ED Notes (Addendum)
Pt pacing the hall in front of nursing station. Pt was offered something to eat and drink, pt declined. Attempted to get VS, pt declined.
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
## 2021-11-12 NOTE — ED Notes (Signed)
Pt pacing around his bed in hall. This RN asked pt if he needed anything, pt declined.

## 2021-11-12 NOTE — Progress Notes (Signed)
Per Molli Barrows, NP, patient meets criteria for inpatient treatment. There are no available beds at [facility] today. CSW faxed referrals to the following facilities for review:  [address] [place] [address] [phone] --  [place] N[address], [place] [address] [phone] --  [address] [place] [address] [phone] --  [address] [place] [address] [phone] --  [place]  Pending - Request Sent N/A [place], [place] [address] [phone] --  [facility] [address] [place], [place] [address] [phone] --  [place] [address] [place] [address] [place] --  [facility]  Pending - Request Sent N/A [address], [place] [address] [place] [address] [place] [address] [phone] --  [facility]  Pending - Request Sent N/A [address] [place] [address] [phone] --  [facility] [place]  Pending - Request Sent N/A [address], [place] [address] [phone] --  [facility]  Pending - Request Sent N/A [place], [place] [address] [phone] --  [facility]  Pending - Request Sent N/A [address] [place] [address] [phone] --  [facility]  Pending - Request Sent N/A  [address] [place] [address] [phone] --  [facility]  Pending - Request Sent N/A [place], [place] [address] [phone] --  [place] N/[address] [place] [place] [address] [phone] --   TTS will continue to seek bed placement.  [place], MSW, [place] Phone: [phone] Disposition/TOC

## 2021-11-12 NOTE — ED Notes (Signed)
Belongings returned to patient.

## 2021-11-12 NOTE — ED Notes (Signed)
Attempted to update VS since pt is awake, pt refusing.

## 2021-11-12 NOTE — ED Notes (Signed)
Patient refusing vital signs at this time, discharge instructions reviewed with patient. Pt denies any concerns at this time. Pt ambulatory to lobby.

## 2021-11-12 NOTE — ED Notes (Signed)
Pt is resting comfortably at this time. Pt has equal chest rise and fall. No acute distress noted at this time. 

## 2021-11-12 NOTE — Discharge Instructions (Signed)
Therapy Walk-in Hours  Monday-Wednesday: 8 AM until slots are full  Friday: 1 PM to 5 PM  For Monday to Wednesday, it is recommended that patients arrive between 7:30 AM and 7:45 AM because patients will be seen in the order of arrival.  For Friday, we ask that patients arrive between 12 PM to 12:30 PM.  Go to the second floor on arrival and check in.  Availability is limited; therefore, patients may not be seen on the same day.  Medication management walk-ins:  Monday to Friday: 8 AM to 11 AM.  It is recommended that patients arrive by 7:30 AM to 7:45 AM because patients will be seen in the order of arrival.  Go to the second floor on arrival and check in.  **Availability is limited; therefore, patients may not be seen on the same day.**  

## 2021-11-12 NOTE — ED Notes (Signed)
Pt voluntarily back in bed at this time.

## 2022-01-30 ENCOUNTER — Emergency Department (HOSPITAL_COMMUNITY): Payer: Medicaid Other

## 2022-01-30 ENCOUNTER — Other Ambulatory Visit: Payer: Self-pay

## 2022-01-30 ENCOUNTER — Emergency Department (HOSPITAL_COMMUNITY)
Admission: EM | Admit: 2022-01-30 | Discharge: 2022-01-30 | Disposition: A | Discharge status: Home / Self Care | Payer: Medicaid Other | Attending: Emergency Medicine | Admitting: Emergency Medicine

## 2022-01-30 ENCOUNTER — Encounter (HOSPITAL_COMMUNITY): Payer: Self-pay | Admitting: Emergency Medicine

## 2022-01-30 DIAGNOSIS — Z5321 Procedure and treatment not carried out due to patient leaving prior to being seen by health care provider: Secondary | ICD-10-CM | POA: Diagnosis not present

## 2022-01-30 DIAGNOSIS — R519 Headache, unspecified: Secondary | ICD-10-CM | POA: Diagnosis not present
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
## 2022-01-30 NOTE — ED Provider Notes (Cosign Needed)
No pt in bed. Pt left ama before evaluation.  Pt's ct scan was normal.    Fransico Meadow, [place] 01/30/22 1850
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
## 2022-01-30 NOTE — ED Notes (Signed)
Pt refused temp 

## 2022-01-30 NOTE — ED Provider Triage Note (Cosign Needed)
Emergency Medicine Provider Triage Evaluation Note  Lance Rich , a 22 y.o. male  was evaluated in triage.  Pt complains of headache for several months.  Notes that he feels like a pressure sensation.  He notes that it feels like someone put a chip inside of his head.  Has a buzzing sensation to his ear since episode device.  Denies SI, HI, auditory/visual hallucinations.  Denies dizziness, lightheadedness, vision changes.  No history of similar symptoms.  Patient requesting CT head.  Review of Systems  Positive:  Negative:   Physical Exam  BP (!) 147/100 (BP Location: Left Arm)   Pulse (!) 124   Resp 16   Ht 5\' 11"  (1.803 m)   Wt 59 kg   SpO2 98%   BMI 18.13 kg/m  Gen:   Awake, no distress   Resp:  Normal effort  MSK:   Moves extremities without difficulty  Other:    Medical Decision Making  Medically screening exam initiated at 4:07 PM.  Appropriate orders placed.  Page Lance Rich was informed that the remainder of the evaluation will be completed by another provider, this initial triage assessment does not replace that evaluation, and the importance of remaining in the ED until their evaluation is complete.  Workup initiated   Lance Rich A, PA-C 01/30/22 1608

## 2022-03-07 ENCOUNTER — Telehealth: Payer: Self-pay

## 2022-03-07 NOTE — Telephone Encounter (Signed)
Mychart msg sent
